# Patient Record
Sex: Male | Born: 1965
Health system: Southern US, Community
[De-identification: ages and names within clinical notes are randomized; demographics above are authoritative.]

## PROBLEM LIST (undated history)

## (undated) DIAGNOSIS — E079 Disorder of thyroid, unspecified: Secondary | ICD-10-CM

## (undated) DIAGNOSIS — Z87828 Personal history of other (healed) physical injury and trauma: Secondary | ICD-10-CM

## (undated) DIAGNOSIS — F329 Major depressive disorder, single episode, unspecified: Secondary | ICD-10-CM

## (undated) DIAGNOSIS — G4730 Sleep apnea, unspecified: Secondary | ICD-10-CM

## (undated) DIAGNOSIS — F32A Depression, unspecified: Secondary | ICD-10-CM

## (undated) DIAGNOSIS — K219 Gastro-esophageal reflux disease without esophagitis: Secondary | ICD-10-CM

## (undated) HISTORY — DX: Major depressive disorder, single episode, unspecified: F32.9

## (undated) HISTORY — PX: MANDIBLE FRACTURE SURGERY: SHX706

## (undated) HISTORY — DX: Disorder of thyroid, unspecified: E07.9

## (undated) HISTORY — DX: Gastro-esophageal reflux disease without esophagitis: K21.9

## (undated) HISTORY — PX: ESOPHAGOGASTRODUODENOSCOPY: SHX1529

## (undated) HISTORY — DX: Depression, unspecified: F32.A

## (undated) HISTORY — DX: Personal history of other (healed) physical injury and trauma: Z87.828

---

## 1997-11-11 ENCOUNTER — Ambulatory Visit (HOSPITAL_COMMUNITY): Admission: RE | Admit: 1997-11-11 | Discharge: 1997-11-11 | Payer: Self-pay | Admitting: Gastroenterology

## 1998-03-08 ENCOUNTER — Ambulatory Visit (HOSPITAL_COMMUNITY): Admission: RE | Admit: 1998-03-08 | Discharge: 1998-03-08 | Payer: Self-pay | Admitting: Obstetrics and Gynecology

## 1998-09-12 ENCOUNTER — Encounter: Payer: Self-pay | Admitting: Gastroenterology

## 1998-09-12 ENCOUNTER — Ambulatory Visit (HOSPITAL_COMMUNITY): Admission: RE | Admit: 1998-09-12 | Discharge: 1998-09-12 | Payer: Self-pay | Admitting: Gastroenterology

## 2007-08-03 ENCOUNTER — Emergency Department (HOSPITAL_COMMUNITY): Admission: EM | Admit: 2007-08-03 | Discharge: 2007-08-03 | Payer: Self-pay | Admitting: Emergency Medicine

## 2007-08-19 ENCOUNTER — Ambulatory Visit (HOSPITAL_COMMUNITY): Admission: RE | Admit: 2007-08-19 | Discharge: 2007-08-19 | Payer: Self-pay | Admitting: Gastroenterology

## 2008-04-05 ENCOUNTER — Encounter: Admission: RE | Admit: 2008-04-05 | Discharge: 2008-06-16 | Payer: Self-pay | Admitting: *Deleted

## 2009-05-18 ENCOUNTER — Emergency Department (HOSPITAL_COMMUNITY): Admission: EM | Admit: 2009-05-18 | Discharge: 2009-05-18 | Payer: Self-pay | Admitting: Emergency Medicine
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
## 2009-11-06 ENCOUNTER — Encounter (INDEPENDENT_AMBULATORY_CARE_PROVIDER_SITE_OTHER): Payer: Self-pay | Admitting: *Deleted

## 2010-02-25 ENCOUNTER — Encounter: Payer: Self-pay | Admitting: Gastroenterology

## 2010-03-06 NOTE — Letter (Signed)
Summary: New Patient letter  [facility]  [address] [place], [place] [address]   Phone: [phone]  Fax: [phone]       11/06/2009 MRN: [id number]  Tim Lopez [address] [facility] [address] [place], [place]  [address]  Dear Mr. Volkman,  Welcome to the Gastroenterology Division at [facility]    You are scheduled to see Dr.  Christella Hartigan on 11-10-09 at 2:00p.m. on the 3rd floor at [facility], [address]  We ask that you try to arrive at our office 15 minutes prior to your appointment time to allow for check-in.  We would like you to complete the enclosed self-administered evaluation form prior to your visit and bring it with you on the day of your appointment.  We will review it with you.  Also, please bring a complete list of all your medications or, if you prefer, bring the medication bottles and we will list them.  Please bring your insurance card so that we may make a copy of it.  If your insurance requires a referral to see a specialist, please bring your referral form from your primary care physician.  Co-payments are due at the time of your visit and may be paid by cash, check or credit card.     Your office visit will consist of a consult with your physician (includes a physical exam), any laboratory testing he/she may order, scheduling of any necessary diagnostic testing (e.g. x-ray, ultrasound, CT-scan), and scheduling of a procedure (e.g. Endoscopy, Colonoscopy) if required.  Please allow enough time on your schedule to allow for any/all of these possibilities.    If you cannot keep your appointment, please call [phone] to cancel or reschedule prior to your appointment date.  This allows [place] the opportunity to schedule an appointment for another patient in need of care.  If you do not cancel or reschedule by 5 p.m. the business day prior to your appointment date, you will be charged a $50.00 late cancellation/no-show fee.    Thank you for choosing  [facility] for your medical needs.  We appreciate the opportunity to care for you.  Please visit [place] at our website  to learn more about our practice.                     Sincerely,                                                             The Gastroenterology Division

## 2010-04-25 LAB — LIPASE, BLOOD: Lipase: 27 U/L (ref 11–59)

## 2010-04-25 LAB — COMPREHENSIVE METABOLIC PANEL
ALT: 22 U/L (ref 0–53)
AST: 29 U/L (ref 0–37)
Albumin: 4.8 g/dL (ref 3.5–5.2)
Alkaline Phosphatase: 97 U/L (ref 39–117)
Calcium: 10.5 mg/dL (ref 8.4–10.5)
GFR calc Af Amer: >60 mL/min (ref >60)
Potassium: 3.5 mEq/L (ref 3.5–5.1)
Sodium: 137 mEq/L (ref 135–145)
Total Protein: 8.7 g/dL — ABNORMAL HIGH (ref 6.0–8.3)

## 2010-04-25 LAB — COMPREHENSIVE METABOLIC PANEL WITH GFR
BUN: 6 mg/dL (ref 6–23)
CO2: 27 meq/L (ref 19–32)
Chloride: 102 meq/L (ref 96–112)
Creatinine, Ser: 0.97 mg/dL (ref 0.4–1.5)
GFR calc non Af Amer: >60 mL/min (ref >60)
Glucose, Bld: 102 mg/dL — ABNORMAL HIGH (ref 70–99)
Total Bilirubin: 1.1 mg/dL (ref 0.3–1.2)

## 2010-04-25 LAB — DIFFERENTIAL
Basophils Absolute: 0 K/uL (ref 0.0–0.1)
Basophils Relative: 0 % (ref 0–1)
Eosinophils Absolute: 0.1 10*3/uL (ref 0.0–0.7)
Eosinophils Relative: 1 % (ref 0–5)
Lymphocytes Relative: 25 % (ref 12–46)
Lymphs Abs: 1.4 10*3/uL (ref 0.7–4.0)
Monocytes Absolute: 0.4 10*3/uL (ref 0.1–1.0)
Monocytes Relative: 7 % (ref 3–12)
Neutro Abs: 3.8 K/uL (ref 1.7–7.7)
Neutrophils Relative %: 66 % (ref 43–77)

## 2010-04-25 LAB — URINE MICROSCOPIC-ADD ON

## 2010-04-25 LAB — URINALYSIS, ROUTINE W REFLEX MICROSCOPIC
Bilirubin Urine: NEGATIVE
Glucose, UA: NEGATIVE mg/dL
Ketones, ur: NEGATIVE mg/dL
Leukocytes, UA: NEGATIVE
Nitrite: NEGATIVE
Protein, ur: NEGATIVE mg/dL
Specific Gravity, Urine: 1.018 (ref 1.005–1.030)
Urobilinogen, UA: 1 mg/dL (ref 0.0–1.0)
pH: 7.5 (ref 5.0–8.0)

## 2010-04-25 LAB — CBC
HCT: 45.3 % (ref 39.0–52.0)
Hemoglobin: 16 g/dL (ref 13.0–17.0)
MCHC: 35.3 g/dL (ref 30.0–36.0)
MCV: 90.1 fL (ref 78.0–100.0)
Platelets: 194 10*3/uL (ref 150–400)
RBC: 5.03 MIL/uL (ref 4.22–5.81)
RDW: 13.1 % (ref 11.5–15.5)
WBC: 5.7 K/uL (ref 4.0–10.5)

## 2010-06-19 NOTE — Op Note (Signed)
NAMEADELFO, Tim Lopez          ACCOUNT NO.:  [id number]   MEDICAL RECORD NO.:  [id number]          PATIENT TYPE:  AMB   LOCATION:  ENDO                         FACILITY:  [facility]   PHYSICIAN:  James L. Malon Kindle., M.D.DATE OF BIRTH:  1965/08/28   DATE OF PROCEDURE:  08/19/2007  DATE OF DISCHARGE:                               OPERATIVE REPORT   PROCEDURE:  Esophagogastroduodenoscopy with Savarydilatation.   ENDOSCOPIST:  Dr. Fayrene Fearing L. Edwards.   INDICATIONS FOR PROCEDURE:  The patient is a nice gentleman who had a  previous dilatation for an esophageal stricture.  He last was dilated in  2000, with __________ then.  He complained of some increased dysphagia  and for this reason he was referred for a dilatation to be performed.   DESCRIPTION OF PROCEDURE:  The procedure was explained to the patient in  the office including the risks of bleeding and perforation.  The patient  was placed in the left lateral decubitus position in the Endo unit using  fluoroscopic guidance.  The scope was passed down into the esophagus.  The distal esophagus was reached and the patient did have a mild  stricture.  This was easily passed.  The stomach was seen well, as was  the duodenum  __________ normal.  The scope was then withdrawn into the  stomach and using fluoroscopic guidance, a Savary guide wire was placed  through the scope and the scope withdrawn over the guide wire.  The  patient's neck was then extended.  The 12.8 mm, 14 mm and 15 mm dilators  were passed using fluoroscopic guidance.  There was a small amount of  heme with the 15 mm dilator.  The total fluoroscopy time was  approximately 1.4 minutes.   The patient tolerated the procedure well.  There were no immediate  complications.   ASSESSMENT:  Esophageal stricture, dilated to 15 mm.   PLAN:  Will recommend clear liquids today.  Routine post-dilatation  orders.  Will also recommend that he take Pepcid or Zantac b.i.d.  Have  him  call the office with a report in one week.           ______________________________  Llana Aliment. Malon Kindle., M.D.     Waldron Session  D:  08/19/2007  T:  08/19/2007  Job:  [id number]   cc:   Samuel Jester  Fax: [phone]

## 2010-10-04 ENCOUNTER — Other Ambulatory Visit: Payer: Self-pay | Admitting: Gastroenterology

## 2010-10-15 ENCOUNTER — Ambulatory Visit
Admission: RE | Admit: 2010-10-15 | Discharge: 2010-10-15 | Disposition: A | Discharge status: Home / Self Care | Payer: Federal, State, Local not specified - PPO | Source: Ambulatory Visit | Attending: Gastroenterology | Admitting: Gastroenterology

## 2013-04-22 ENCOUNTER — Other Ambulatory Visit: Payer: Self-pay | Admitting: Physician Assistant

## 2013-04-22 ENCOUNTER — Ambulatory Visit
Admission: RE | Admit: 2013-04-22 | Discharge: 2013-04-22 | Disposition: A | Discharge status: Home / Self Care | Payer: Federal, State, Local not specified - PPO | Source: Ambulatory Visit | Attending: Physician Assistant | Admitting: Physician Assistant

## 2013-04-22 DIAGNOSIS — R6884 Jaw pain: Secondary | ICD-10-CM

## 2013-04-22 DIAGNOSIS — T1490XA Injury, unspecified, initial encounter: Secondary | ICD-10-CM

## 2014-12-19 ENCOUNTER — Other Ambulatory Visit: Payer: Self-pay | Admitting: Gastroenterology

## 2014-12-19 DIAGNOSIS — R1310 Dysphagia, unspecified: Secondary | ICD-10-CM

## 2015-01-02 ENCOUNTER — Ambulatory Visit
Admission: RE | Admit: 2015-01-02 | Discharge: 2015-01-02 | Disposition: A | Discharge status: Home / Self Care | Payer: Federal, State, Local not specified - PPO | Source: Ambulatory Visit | Attending: Gastroenterology | Admitting: Gastroenterology

## 2015-01-02 DIAGNOSIS — R1310 Dysphagia, unspecified: Secondary | ICD-10-CM

## 2015-10-02 ENCOUNTER — Encounter: Payer: Self-pay | Admitting: Physician Assistant

## 2015-10-02 ENCOUNTER — Ambulatory Visit (INDEPENDENT_AMBULATORY_CARE_PROVIDER_SITE_OTHER): Payer: Federal, State, Local not specified - PPO | Admitting: Physician Assistant

## 2015-10-02 VITALS — BP 125/87 | HR 69 | Temp 97.3°F | Ht 72.0 in | Wt 195.4 lb

## 2015-10-02 DIAGNOSIS — E039 Hypothyroidism, unspecified: Secondary | ICD-10-CM | POA: Diagnosis not present

## 2015-10-02 DIAGNOSIS — K2100 Gastro-esophageal reflux disease with esophagitis, without bleeding: Secondary | ICD-10-CM | POA: Insufficient documentation

## 2015-10-02 DIAGNOSIS — F329 Major depressive disorder, single episode, unspecified: Secondary | ICD-10-CM | POA: Diagnosis not present

## 2015-10-02 DIAGNOSIS — Z6826 Body mass index (BMI) 26.0-26.9, adult: Secondary | ICD-10-CM | POA: Diagnosis not present

## 2015-10-02 DIAGNOSIS — K222 Esophageal obstruction: Secondary | ICD-10-CM | POA: Diagnosis not present

## 2015-10-02 DIAGNOSIS — F32A Depression, unspecified: Secondary | ICD-10-CM

## 2015-10-02 MED ORDER — BUPROPION HCL ER (XL) 150 MG PO TB24: 150.0000 mg | ORAL_TABLET | Freq: Every day | ORAL | 1 refills | Status: DC

## 2015-10-02 MED ORDER — ESOMEPRAZOLE MAGNESIUM 40 MG PO CPDR: 40.0000 mg | DELAYED_RELEASE_CAPSULE | Freq: Every day | ORAL | 11 refills | Status: DC

## 2015-10-02 NOTE — Patient Instructions (Signed)
  Adjustment Disorder Adjustment disorder is an unusually severe reaction to a stressful life event, such as the loss of a job or physical illness. The event may be any stressful event other than the loss of a loved one. Adjustment disorder may affect your feelings, your thinking, how you act, or a combination of these. It may interfere with personal relationships or with the way you are at work, school, or home. People with this disorder are at risk for suicide and substance abuse. They may develop a more serious mental disorder, such as major depressive disorder or post-traumatic stress disorder. SIGNS AND SYMPTOMS  Symptoms may include:  Sadness, depressed mood, or crying spells.  Loss of enjoyment.  Change in appetite or weight.  Sense of loss or hopelessness.  Thoughts of suicide.  Anxiety, worry, or nervousness.  Trouble sleeping.  Avoiding family and friends.  Poor school performance.  Fighting or vandalism.  Reckless driving.  Skipping school.  Poor work performance.  Ignoring bills. Symptoms of adjustment disorder start within 3 months of the stressful life event. They do not last more than 6 months after the event has ended. DIAGNOSIS  To make a diagnosis, your health care provider will ask about what has happened in your life and how it has affected you. He or she may also ask about your medical history and use of medicines, alcohol, and other substances. Your health care provider may do a physical exam and order lab tests or other studies. You may be referred to a mental health specialist for evaluation. TREATMENT  Treatment options include:  Counseling or talk therapy. Talk therapy is usually provided by mental health specialists.  Medicine. Certain medicines may help with depression, anxiety, and sleep.  Support groups. Support groups offer emotional support, advice, and guidance. They are made up of people who have had similar experiences. HOME CARE  INSTRUCTIONS  Keep all follow-up visits as directed by your health care provider. This is important.  Take medicines only as directed by your health care provider. SEEK MEDICAL CARE IF:  Your symptoms get worse.  SEEK IMMEDIATE MEDICAL CARE IF: You have serious thoughts about hurting yourself or someone else. MAKE SURE YOU:  Understand these instructions.  Will watch your condition.  Will get help right away if you are not doing well or get worse.   This information is not intended to replace advice given to you by your health care provider. Make sure you discuss any questions you have with your health care provider.   Document Released: 09/25/2005 Document Revised: 02/11/2014 Document Reviewed: 06/15/2013 Elsevier Interactive Patient Education 2016 Elsevier Inc.  

## 2015-10-02 NOTE — Progress Notes (Signed)
BP 125/87 (BP Location: Left Arm, Patient Position: Sitting, Cuff Size: Large)   Pulse 69   Temp 97.3 F (36.3 C) (Oral)   Ht 6' (1.829 m)   Wt 195 lb 6.4 oz (88.6 kg)   BMI 26.50 kg/m    Subjective:    Patient ID: Tim PriestChristopher T Parkhurst, male    DOB: [birth year], 50 y.o.   MRN: [id number]  HPI: Tim Lopez is a 50 y.o. male presenting on 10/02/2015 for Stress (Was put on citalopram about 90 days ago and he stopped it a week after he started due to keeping him up at night ) Patient here to be [facility] as [facility].  This patient [facility] to [facility]. HPI:  HE has a return of the stress and overwhelming feeling he has had in th epast. He was recognized as [employer] needs [employer].  He works a tremendous amount and is always very busy.  At this time he feels that he cannot keep up with his daily routine and work load. He has a lot of work assignments this week and cannot be out but thinks that a short leave would be beneficial. He has had to do this in the past. His GERD is also much worse and needs different treatment. Currently he has only taken OC omprazole and has had breakthrough episodes. All medications are reviewed.  Relevant past medical, surgical, family and social history reviewed and updated as indicated. Interim medical history since our last visit reviewed. Allergies and medications reviewed and updated.   Data reviewed from any sources in EPIC.  Review of Systems  Constitutional: Positive for activity change and fatigue. Negative for appetite change.  HENT: Negative.   Eyes: Negative.  Negative for pain and visual disturbance.  Respiratory: Negative.  Negative for cough, chest tightness, shortness of breath and wheezing.   Cardiovascular: Negative.  Negative for chest pain, palpitations and leg swelling.  Gastrointestinal: Negative.  Negative for abdominal pain, blood in stool, diarrhea, nausea and  vomiting.  Endocrine: Negative.   Genitourinary: Negative.   Musculoskeletal: Negative.   Skin: Negative.  Negative for color change and rash.  Neurological: Negative.  Negative for weakness, numbness and headaches.  Psychiatric/Behavioral: Positive for agitation, decreased concentration and sleep disturbance. The patient is nervous/anxious.     Per HPI unless specifically indicated above  Social History   Social History  . Marital status: Married    Spouse name: N/A  . Number of children: N/A  . Years of education: N/A   Occupational History  . Not on file.   Social History Main Topics  . Smoking status: Never Smoker  . Smokeless tobacco: Never Used  . Alcohol use No  . Drug use: No  . Sexual activity: Not on file   Other Topics Concern  . Not on file   Social History Narrative  . No narrative on file    History reviewed. No pertinent surgical history.  Family History  Problem Relation Age of Onset  . Arthritis Father   . Diabetes Father   . Hyperlipidemia Father   . Hypertension Father       Medication List       Accurate as of 10/02/15 11:31 AM. Always use your most recent med list.          buPROPion 150 MG 24 hr tablet Commonly known as:  WELLBUTRIN XL Take 1 tablet (150 mg total) by mouth daily. Increase  to 2 tabs QAM in 1-2 weeks   esomeprazole 40 MG capsule Commonly known as:  NEXIUM Take 1 capsule (40 mg total) by mouth daily.   levothyroxine 88 MCG tablet Commonly known as:  SYNTHROID, LEVOTHROID Take 88 mcg by mouth daily before breakfast.          Objective:    BP 125/87 (BP Location: Left Arm, Patient Position: Sitting, Cuff Size: Large)   Pulse 69   Temp 97.3 F (36.3 C) (Oral)   Ht 6' (1.829 m)   Wt 195 lb 6.4 oz (88.6 kg)   BMI 26.50 kg/m   Allergies  Allergen Reactions  . Morphine And Related Hives   Wt Readings from Last 3 Encounters:  10/02/15 195 lb 6.4 oz (88.6 kg)    Physical Exam  Constitutional: He appears  well-developed and well-nourished.  HENT:  Head: Normocephalic and atraumatic.  Eyes: Conjunctivae and EOM are normal. Pupils are equal, round, and reactive to light.  Neck: Normal range of motion. Neck supple.  Cardiovascular: Normal rate, regular rhythm and normal heart sounds.   Pulmonary/Chest: Effort normal and breath sounds normal.  Abdominal: Soft. Bowel sounds are normal.  Musculoskeletal: Normal range of motion.  Skin: Skin is warm and dry.        Assessment & Plan:   1. Hypothyroidism, unspecified hypothyroidism type Labs performed in last 90 days and was normal, continue levothyroxine one daily  2. Gastroesophageal reflux disease with esophagitis Watch diet, stop prilosec, start new med - esomeprazole (NEXIUM) 40 MG capsule; Take 1 capsule (40 mg total) by mouth daily.  Dispense: 30 capsule; Refill: 11  3. Stricture and stenosis of esophagus Refer in future if symptoms do not resolve  4. Body mass index 26.0-26.9, adult Walking daily  5. Depression Stress reduction - buPROPion (WELLBUTRIN XL) 150 MG 24 hr tablet; Take 1 tablet (150 mg total) by mouth daily. Increase to 2 tabs QAM in 1-2 weeks  Dispense: 60 tablet; Refill: 1   Continue all other maintenance medications as listed above.  Follow up plan: Return in about 4 weeks (around 10/30/2015) for recheck meds anad lab.  Remus Loffler PA-C [facility] [address]  [place], [place] [address] [phone]   10/02/2015, 11:31 AM

## 2015-10-19 ENCOUNTER — Encounter: Payer: Self-pay | Admitting: Physician Assistant

## 2015-10-26 ENCOUNTER — Encounter: Payer: Self-pay | Admitting: Physician Assistant

## 2015-10-26 ENCOUNTER — Encounter: Payer: Self-pay | Admitting: *Deleted

## 2015-10-26 DIAGNOSIS — F4310 Post-traumatic stress disorder, unspecified: Secondary | ICD-10-CM | POA: Insufficient documentation

## 2015-10-30 MED ORDER — IBUPROFEN 800 MG PO TABS: 800.0000 mg | ORAL_TABLET | Freq: Three times a day (TID) | ORAL | 0 refills | Status: DC | PRN

## 2015-11-06 ENCOUNTER — Encounter: Payer: Self-pay | Admitting: *Deleted

## 2016-01-13 ENCOUNTER — Other Ambulatory Visit: Payer: Self-pay | Admitting: Physician Assistant

## 2016-04-01 ENCOUNTER — Encounter: Payer: Self-pay | Admitting: Physician Assistant

## 2016-04-01 ENCOUNTER — Ambulatory Visit (INDEPENDENT_AMBULATORY_CARE_PROVIDER_SITE_OTHER): Payer: Federal, State, Local not specified - PPO | Admitting: Physician Assistant

## 2016-04-01 VITALS — BP 117/80 | HR 78 | Temp 97.5°F | Ht 72.0 in | Wt 192.6 lb

## 2016-04-01 DIAGNOSIS — Z87828 Personal history of other (healed) physical injury and trauma: Secondary | ICD-10-CM

## 2016-04-01 DIAGNOSIS — R413 Other amnesia: Secondary | ICD-10-CM

## 2016-04-01 DIAGNOSIS — F320 Major depressive disorder, single episode, mild: Secondary | ICD-10-CM | POA: Diagnosis not present

## 2016-04-01 MED ORDER — BUPROPION HCL ER (XL) 150 MG PO TB24: 150.0000 mg | ORAL_TABLET | Freq: Every day | ORAL | 1 refills | Status: DC

## 2016-04-01 NOTE — Patient Instructions (Signed)
In a few days you may receive a survey in the mail or online from [employer] regarding your visit with us today. Please take a moment to fill this out. Your feedback is very important to our whole office. It can help us better understand your needs as well as improve your experience and satisfaction. Thank you for taking your time to complete it. We care about you.  Jeremaine Maraj, PA-C  
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
## 2016-04-02 ENCOUNTER — Other Ambulatory Visit: Payer: Self-pay | Admitting: Physician Assistant

## 2016-04-02 LAB — TSH: TSH: 5.08 u[IU]/mL — AB (ref 0.450–4.500)

## 2016-04-02 MED ORDER — LEVOTHYROXINE SODIUM 100 MCG PO TABS: 100.0000 ug | ORAL_TABLET | Freq: Every day | ORAL | 0 refills | Status: DC

## 2016-04-04 ENCOUNTER — Encounter: Payer: Self-pay | Admitting: Physician Assistant

## 2016-04-05 DIAGNOSIS — Z0289 Encounter for other administrative examinations: Secondary | ICD-10-CM

## 2016-04-15 ENCOUNTER — Other Ambulatory Visit: Payer: Self-pay | Admitting: Physician Assistant

## 2016-04-19 ENCOUNTER — Ambulatory Visit (INDEPENDENT_AMBULATORY_CARE_PROVIDER_SITE_OTHER): Payer: Federal, State, Local not specified - PPO | Admitting: Neurology

## 2016-04-19 ENCOUNTER — Encounter: Payer: Self-pay | Admitting: Neurology

## 2016-04-19 VITALS — BP 114/72 | HR 75 | Ht 72.0 in | Wt 195.0 lb

## 2016-04-19 DIAGNOSIS — R413 Other amnesia: Secondary | ICD-10-CM | POA: Diagnosis not present

## 2016-04-19 DIAGNOSIS — G479 Sleep disorder, unspecified: Secondary | ICD-10-CM | POA: Diagnosis not present

## 2016-04-19 NOTE — Patient Instructions (Signed)
   We will get blood work today and get MRI of the brain and get a sleep evaluation.
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
## 2016-04-20 LAB — HIV ANTIBODY (ROUTINE TESTING W REFLEX): HIV Screen 4th Generation wRfx: NONREACTIVE

## 2016-04-20 LAB — RPR: RPR Ser Ql: NONREACTIVE

## 2016-04-20 LAB — VITAMIN B12: Vitamin B-12: 503 pg/mL (ref 232–1245)

## 2016-04-20 LAB — SEDIMENTATION RATE: SED RATE: 2 mm/h (ref 0–30)

## 2016-05-03 ENCOUNTER — Telehealth: Payer: Self-pay | Admitting: Neurology

## 2016-05-03 ENCOUNTER — Ambulatory Visit
Admission: RE | Admit: 2016-05-03 | Discharge: 2016-05-03 | Disposition: A | Discharge status: Home / Self Care | Payer: Federal, State, Local not specified - PPO | Source: Ambulatory Visit | Attending: Neurology | Admitting: Neurology

## 2016-05-03 DIAGNOSIS — R413 Other amnesia: Secondary | ICD-10-CM

## 2016-05-03 NOTE — Telephone Encounter (Signed)
I called patient. MRI the brain is completely normal, I discussed this with him. He will have a sleep evaluation in the near future.   MRI brain 05/03/16:  IMPRESSION:  Normal MRI scan the brain without contrast

## 2016-05-15 ENCOUNTER — Encounter: Payer: Self-pay | Admitting: Neurology

## 2016-05-15 ENCOUNTER — Ambulatory Visit (INDEPENDENT_AMBULATORY_CARE_PROVIDER_SITE_OTHER): Payer: Federal, State, Local not specified - PPO | Admitting: Neurology

## 2016-05-15 VITALS — BP 134/84 | HR 72 | Resp 20 | Ht 72.0 in | Wt 192.0 lb

## 2016-05-15 DIAGNOSIS — G4719 Other hypersomnia: Secondary | ICD-10-CM

## 2016-05-15 DIAGNOSIS — R413 Other amnesia: Secondary | ICD-10-CM

## 2016-05-15 DIAGNOSIS — R0683 Snoring: Secondary | ICD-10-CM

## 2016-05-15 NOTE — Patient Instructions (Addendum)
Based on your symptoms and your exam I believe you are at risk for obstructive sleep apnea or OSA, and I think we should proceed with a sleep study to determine whether you do or do not have OSA and how severe it is. If you have more than mild OSA, I want you to consider treatment with CPAP. Please remember, the risks and ramifications of moderate to severe obstructive sleep apnea or OSA are: Cardiovascular disease, including congestive heart failure, stroke, difficult to control hypertension, arrhythmias, and even type 2 diabetes has been linked to untreated OSA. Sleep apnea causes disruption of sleep and sleep deprivation in most cases, which, in turn, can cause recurrent headaches, problems with memory, mood, concentration, focus, and vigilance. Most people with untreated sleep apnea report excessive daytime sleepiness, which can affect their ability to drive. Please do not drive if you feel sleepy.   I will likely see you back after your sleep study to go over the test results and where to go from there. We will call you after your sleep study to advise about the results (most likely, you will hear from Lafonda Mosses, my nurse) and to set up an appointment at the time, as necessary.    Our sleep lab administrative assistant, Alvis Lemmings will meet with you or call you to schedule your sleep study. If you don't hear back from her by next week please feel free to call her at [phone]. This is her direct line and please leave a message with your phone number to call back if you get the voicemail box. She will call back as soon as possible.   Please think about doing the sleep study.
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
## 2016-05-18 ENCOUNTER — Encounter: Payer: Self-pay | Admitting: Physician Assistant

## 2016-05-28 ENCOUNTER — Encounter: Payer: Self-pay | Admitting: Physician Assistant

## 2016-06-03 ENCOUNTER — Telehealth: Payer: Self-pay | Admitting: Physician Assistant

## 2016-06-03 ENCOUNTER — Encounter: Payer: Self-pay | Admitting: *Deleted

## 2016-06-03 NOTE — Telephone Encounter (Signed)
This is true, can anyone tell if the letter has been done? I had said it was okay.

## 2016-06-03 NOTE — Telephone Encounter (Signed)
Patient aware that letter has been wrote.

## 2016-07-06 ENCOUNTER — Other Ambulatory Visit: Payer: Self-pay | Admitting: Physician Assistant

## 2016-10-16 ENCOUNTER — Other Ambulatory Visit: Payer: Self-pay | Admitting: Physician Assistant

## 2016-11-06 ENCOUNTER — Other Ambulatory Visit: Payer: Self-pay | Admitting: Physician Assistant

## 2016-11-06 DIAGNOSIS — K2100 Gastro-esophageal reflux disease with esophagitis, without bleeding: Secondary | ICD-10-CM

## 2016-11-27 ENCOUNTER — Other Ambulatory Visit: Payer: Self-pay | Admitting: Physician Assistant

## 2017-01-02 ENCOUNTER — Other Ambulatory Visit: Payer: Self-pay | Admitting: Physician Assistant

## 2017-01-02 DIAGNOSIS — K2100 Gastro-esophageal reflux disease with esophagitis, without bleeding: Secondary | ICD-10-CM

## 2017-01-02 NOTE — Telephone Encounter (Signed)
Last seen 04/01/16  [facility]

## 2017-05-12 ENCOUNTER — Other Ambulatory Visit: Payer: Self-pay | Admitting: Physician Assistant

## 2017-05-19 ENCOUNTER — Encounter: Payer: Self-pay | Admitting: Physician Assistant

## 2017-05-19 ENCOUNTER — Telehealth: Payer: Self-pay | Admitting: Physician Assistant

## 2017-05-19 ENCOUNTER — Other Ambulatory Visit: Payer: Self-pay | Admitting: Physician Assistant

## 2017-05-19 MED ORDER — LEVOTHYROXINE SODIUM 100 MCG PO TABS: ORAL_TABLET | ORAL | 0 refills | Status: DC

## 2017-05-19 NOTE — Telephone Encounter (Signed)
Patient's wife aware that prescription was sent in.

## 2017-05-19 NOTE — Telephone Encounter (Signed)
Refill sent.

## 2017-06-06 ENCOUNTER — Ambulatory Visit: Payer: Federal, State, Local not specified - PPO | Admitting: Physician Assistant

## 2017-06-06 ENCOUNTER — Encounter: Payer: Self-pay | Admitting: Physician Assistant

## 2017-06-06 VITALS — BP 102/70 | HR 109 | Ht 72.0 in | Wt 198.6 lb

## 2017-06-06 DIAGNOSIS — K2100 Gastro-esophageal reflux disease with esophagitis, without bleeding: Secondary | ICD-10-CM

## 2017-06-06 DIAGNOSIS — E039 Hypothyroidism, unspecified: Secondary | ICD-10-CM | POA: Diagnosis not present

## 2017-06-06 DIAGNOSIS — Z0000 Encounter for general adult medical examination without abnormal findings: Secondary | ICD-10-CM

## 2017-06-06 MED ORDER — OMEPRAZOLE 20 MG PO CPDR: 20.0000 mg | DELAYED_RELEASE_CAPSULE | Freq: Every day | ORAL | 3 refills | Status: DC

## 2017-06-06 MED ORDER — LEVOTHYROXINE SODIUM 100 MCG PO TABS: ORAL_TABLET | ORAL | 3 refills | Status: DC

## 2017-06-07 LAB — CMP14+EGFR
A/G RATIO: 1.8 (ref 1.2–2.2)
ALK PHOS: 77 IU/L (ref 39–117)
ALT: 40 IU/L (ref 0–44)
AST: 46 IU/L — ABNORMAL HIGH (ref 0–40)
Albumin: 4.6 g/dL (ref 3.5–5.5)
BILIRUBIN TOTAL: 0.8 mg/dL (ref 0.0–1.2)
BUN / CREAT RATIO: 10 (ref 9–20)
BUN: 13 mg/dL (ref 6–24)
CHLORIDE: 102 mmol/L (ref 96–106)
CO2: 29 mmol/L (ref 20–29)
Calcium: 10.3 mg/dL — ABNORMAL HIGH (ref 8.7–10.2)
Creatinine, Ser: 1.34 mg/dL — ABNORMAL HIGH (ref 0.76–1.27)
GFR calc Af Amer: 70 mL/min/{1.73_m2} (ref >59)
GFR calc non Af Amer: 61 mL/min/{1.73_m2} (ref >59)
GLOBULIN, TOTAL: 2.6 g/dL (ref 1.5–4.5)
Glucose: 103 mg/dL — ABNORMAL HIGH (ref 65–99)
POTASSIUM: 4.9 mmol/L (ref 3.5–5.2)
SODIUM: 141 mmol/L (ref 134–144)
Total Protein: 7.2 g/dL (ref 6.0–8.5)

## 2017-06-07 LAB — CBC WITH DIFFERENTIAL/PLATELET
BASOS ABS: 0 10*3/uL (ref 0.0–0.2)
Basos: 1 %
EOS (ABSOLUTE): 0.2 10*3/uL (ref 0.0–0.4)
EOS: 3 %
HEMATOCRIT: 40.3 % (ref 37.5–51.0)
Hemoglobin: 14.5 g/dL (ref 13.0–17.7)
Immature Grans (Abs): 0 10*3/uL (ref 0.0–0.1)
Immature Granulocytes: 0 %
LYMPHS ABS: 2.4 10*3/uL (ref 0.7–3.1)
Lymphs: 38 %
MCH: 30.6 pg (ref 26.6–33.0)
MCHC: 36 g/dL — AB (ref 31.5–35.7)
MCV: 85 fL (ref 79–97)
MONOCYTES: 8 %
MONOS ABS: 0.5 10*3/uL (ref 0.1–0.9)
NEUTROS ABS: 3.2 10*3/uL (ref 1.4–7.0)
Neutrophils: 50 %
Platelets: 193 10*3/uL (ref 150–379)
RBC: 4.74 x10E6/uL (ref 4.14–5.80)
RDW: 14 % (ref 12.3–15.4)
WBC: 6.4 10*3/uL (ref 3.4–10.8)

## 2017-06-07 LAB — TSH: TSH: 3.58 u[IU]/mL (ref 0.450–4.500)
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
## 2017-06-16 ENCOUNTER — Other Ambulatory Visit: Payer: Self-pay | Admitting: Physician Assistant

## 2017-06-16 ENCOUNTER — Encounter: Payer: Self-pay | Admitting: Physician Assistant

## 2017-06-16 MED ORDER — PREDNISONE 10 MG (21) PO TBPK: ORAL_TABLET | ORAL | 0 refills | Status: DC

## 2017-06-19 ENCOUNTER — Other Ambulatory Visit: Payer: Self-pay | Admitting: Physician Assistant

## 2017-06-26 ENCOUNTER — Ambulatory Visit: Payer: Federal, State, Local not specified - PPO | Admitting: Physician Assistant

## 2017-06-26 ENCOUNTER — Encounter: Payer: Self-pay | Admitting: Physician Assistant

## 2017-06-26 VITALS — BP 123/82 | HR 102 | Temp 96.8°F | Ht 72.0 in | Wt 199.4 lb

## 2017-06-26 DIAGNOSIS — M7022 Olecranon bursitis, left elbow: Secondary | ICD-10-CM

## 2017-06-26 MED ORDER — CEPHALEXIN 500 MG PO CAPS: 500.0000 mg | ORAL_CAPSULE | Freq: Four times a day (QID) | ORAL | 0 refills | Status: DC

## 2017-06-26 MED ORDER — DICLOFENAC SODIUM 75 MG PO TBEC: 75.0000 mg | DELAYED_RELEASE_TABLET | Freq: Two times a day (BID) | ORAL | 0 refills | Status: DC

## 2017-06-26 NOTE — Progress Notes (Signed)
BP 123/82   Pulse (!) 102   Temp (!) 96.8 F (36 C) (Oral)   Ht 6' (1.829 m)   Wt 199 lb 6.4 oz (90.4 kg)   BMI 27.04 kg/m     Subjective:    Patient ID: Tim Lopez, male    DOB: 12/24/1965, 52 y.o.   MRN: [id number]  HPI: Tim Lopez is a 52 y.o. male presenting on 06/26/2017 for Elbow Pain (left elbow red and swollen )  For the past 2 days the patient has had increased swelling of the left elbow.  Last week he did have  poison oak in that area and did have an open wound on his skin.  He has had significant swelling.  He denies any injury.    Past Medical History:  Diagnosis Date  . Depression   . GERD (gastroesophageal reflux disease)   . H/O head injury    jaw fracture, dental injury, skull fracture. 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 Thyroid disease    Relevant past medical, surgical, family and social history reviewed and updated as indicated. Interim medical history since our last visit reviewed. Allergies and medications reviewed and updated. DATA REVIEWED: CHART IN EPIC  Family History reviewed for pertinent findings.  Review of Systems  Constitutional: Negative.  Negative for appetite change and fatigue.  Eyes: Negative for pain and visual disturbance.  Respiratory: Negative.  Negative for cough, chest tightness, shortness of breath and wheezing.   Cardiovascular: Negative.  Negative for chest pain, palpitations and leg swelling.  Gastrointestinal: Negative.  Negative for abdominal pain, diarrhea, nausea and vomiting.  Genitourinary: Negative.   Musculoskeletal: Positive for joint swelling and myalgias.  Skin: Negative.  Negative for color change and rash.  Neurological: Negative.  Negative for weakness, numbness and headaches.  Psychiatric/Behavioral: Negative.     Allergies as of 06/26/2017      Reactions   Morphine And Related Hives      Medication List        Accurate as of 06/26/17  3:23 PM. Always use your  most recent med list.          cephALEXin 500 MG capsule Commonly known as:  KEFLEX Take 1 capsule (500 mg total) by mouth 4 (four) times daily.   diclofenac 75 MG EC tablet Commonly known as:  VOLTAREN Take 1 tablet (75 mg total) by mouth 2 (two) times daily.   ibuprofen 800 MG tablet Commonly known as:  ADVIL,MOTRIN TAKE 1 TABLET BY MOUTH EVERY 8 HOURS AS NEEDED   levothyroxine 100 MCG tablet Commonly known as:  SYNTHROID, LEVOTHROID TAKE 1 TABLET BY MOUTH ONCE DAILY BEFORE BREAKFAST   omeprazole 20 MG capsule Commonly known as:  PRILOSEC Take 1 capsule (20 mg total) by mouth daily.          Objective:    BP 123/82   Pulse (!) 102   Temp (!) 96.8 F (36 C) (Oral)   Ht 6' (1.829 m)   Wt 199 lb 6.4 oz (90.4 kg)   BMI 27.04 kg/m    Allergies  Allergen Reactions  . Morphine And Related Hives    Wt Readings from Last 3 Encounters:  06/26/17 199 lb 6.4 oz (90.4 kg)  06/06/17 198 lb 9.6 oz (90.1 kg)  05/15/16 192 lb (87.1 kg)    Physical Exam  Constitutional: He appears well-developed and well-nourished. No distress.  HENT:  Head: Normocephalic and atraumatic.  Eyes: Pupils are equal,  round, and reactive to light. Conjunctivae and EOM are normal.  Cardiovascular: Normal rate, regular rhythm and normal heart sounds.  Pulmonary/Chest: Effort normal and breath sounds normal. No respiratory distress.  Musculoskeletal:       Left elbow: He exhibits swelling. Tenderness found. Lateral epicondyle tenderness noted.       Arms: Skin: Skin is warm and dry.  Psychiatric: He has a normal mood and affect. His behavior is normal.  Nursing note and vitals reviewed.       Assessment & Plan:   1. Olecranon bursitis of left elbow - diclofenac (VOLTAREN) 75 MG EC tablet; Take 1 tablet (75 mg total) by mouth 2 (two) times daily.  Dispense: 60 tablet; Refill: 0 - cephALEXin (KEFLEX) 500 MG capsule; Take 1 capsule (500 mg total) by mouth 4 (four) times daily.  Dispense: 40  capsule; Refill: 0    Continue all other maintenance medications as listed above.  Follow up plan: No follow-ups on file.  Educational handout given for bursitis  Remus Loffler PA-C [facility] [address]  [place], [place] [address] [phone]   06/26/2017, 3:23 PM

## 2017-06-26 NOTE — Patient Instructions (Signed)
Elbow Bursitis A bursa is a fluid-filled sac that covers and protects a joint. Bursitis is when the fluid-filled sac gets puffy and sore (inflamed). Elbow bursitis, also called olecranon bursitis, happens over your elbow. This may be caused by:  Injury (acute trauma) to your elbow.  Leaning on hard surfaces for long periods of time.  Infection from an injury that breaks the skin near your elbow.  A bone growth (spur) that forms at the tip of your elbow.  A medical condition that causes inflammation in your body, such as: ? Gout. ? Rheumatoid arthritis.  Sometimes the cause is not known. Follow these instructions at home:  Take medicines only as told by your doctor.  If you were prescribed an antibiotic medicine, finish all of it even if you start to feel better.  If your bursitis is caused by an injury, rest your elbow and wear your bandage as told by your doctor. You may also apply ice to the injured area as told by your doctor: ? Put ice in a plastic bag. ? Place a towel between your skin and the bag. ? Leave the ice on for 20 minutes, 2-3 times per day.  Do not do any activities that cause pain to your elbow.  Use elbow pads or wraps to cushion your elbow. Contact a doctor if:  You have a fever.  Your symptoms do not get better with treatment.  Your pain or swelling gets worse.  Your pain or swelling goes away and then comes back.  You have drainage of pus from the swollen area over your elbow. This information is not intended to replace advice given to you by your health care provider. Make sure you discuss any questions you have with your health care provider. Document Released: 07/11/2009 Document Revised: 06/29/2015 Document Reviewed: 09/29/2013 Elsevier Interactive Patient Education  2018 Elsevier Inc.  

## 2017-08-19 ENCOUNTER — Other Ambulatory Visit: Payer: Self-pay | Admitting: Physician Assistant

## 2017-09-23 ENCOUNTER — Encounter: Payer: Self-pay | Admitting: Nurse Practitioner

## 2017-09-23 ENCOUNTER — Ambulatory Visit: Payer: Federal, State, Local not specified - PPO | Admitting: Nurse Practitioner

## 2017-09-23 VITALS — Temp 98.6°F | Ht 72.0 in | Wt 199.0 lb

## 2017-09-23 DIAGNOSIS — L03032 Cellulitis of left toe: Secondary | ICD-10-CM | POA: Diagnosis not present

## 2017-09-23 MED ORDER — SULFAMETHOXAZOLE-TRIMETHOPRIM 800-160 MG PO TABS: 1.0000 | ORAL_TABLET | Freq: Two times a day (BID) | ORAL | 0 refills | Status: DC

## 2017-09-23 NOTE — Patient Instructions (Signed)
Paronychia  Paronychia is an infection of the skin. It happens near a fingernail or toenail. It may cause pain and swelling around the nail. Usually, it is not serious and it clears up with treatment.  Follow these instructions at home:   Soak the fingers or toes in warm water as told by your doctor. You may be told to do this for 20 minutes, 2-3 times a day.   Keep the area dry when you are not soaking it.   Take medicines only as told by your doctor.   If you were given an antibiotic medicine, finish all of it even if you start to feel better.   Keep the affected area clean.   Do not try to drain a fluid-filled bump yourself.   Wear rubber gloves when putting your hands in water.   Wear gloves if your hands might touch cleaners or chemicals.   Follow your doctor's instructions about:  ? Wound care.  ? Bandage (dressing) changes and removal.  Contact a doctor if:   Your symptoms get worse or do not improve.   You have a fever or chills.   You have redness spreading from the affected area.   You have more fluid, blood, or pus coming from the affected area.   Your finger or knuckle is swollen or is hard to move.  This information is not intended to replace advice given to you by your health care provider. Make sure you discuss any questions you have with your health care provider.  Document Released: 01/09/2009 Document Revised: 06/29/2015 Document Reviewed: 12/29/2013  Elsevier Interactive Patient Education  2018 Elsevier Inc.

## 2017-09-23 NOTE — Progress Notes (Signed)
   Subjective:    Patient ID: Tim Lopez, male    DOB: March 01, 1965, 52 y.o.   MRN: [id number]   Chief Complaint: Nail Problem   HPI Patient said that left first toe started throbbing 2 nights ago. Woke up yesterday morning and toe was red around nail bed. Denies any drainage   Review of Systems  Constitutional: Negative.   Respiratory: Negative.   Cardiovascular: Negative.   Musculoskeletal: Negative.   Skin:       reddness around left great toe  Neurological: Negative.   Psychiatric/Behavioral: Negative.   All other systems reviewed and are negative.      Objective:   Physical Exam  Constitutional: He is oriented to person, place, and time. He appears well-developed and well-nourished.  Cardiovascular: Normal rate.  Pulmonary/Chest: Effort normal.  Neurological: He is alert and oriented to person, place, and time.  Skin: Skin is warm.  Erythematous tender medial nail border left great toe.    Temp 98.6 F (37 C) (Oral)   Ht 6' (1.829 m)   Wt 199 lb (90.3 kg)   BMI 26.99 kg/m        Assessment & Plan:  Tim Lopez in today with chief complaint of Nail Problem   1. Infection of nail bed of toe of left foot Soak in epsom salt bid Do not pick or scratch Meds ordered this encounter  Medications  . sulfamethoxazole-trimethoprim (BACTRIM DS) 800-160 MG tablet    Sig: Take 1 tablet by mouth 2 (two) times daily.    Dispense:  14 tablet    Refill:  0    Order Specific Question:   Supervising Provider    Answer:   Johna SheriffVINCENT, CAROL L [[id number]]   Mary-Margaret Daphine DeutscherMartin, FNP

## 2017-11-04 ENCOUNTER — Other Ambulatory Visit: Payer: Self-pay | Admitting: Physician Assistant

## 2017-12-22 ENCOUNTER — Encounter: Payer: Self-pay | Admitting: Physician Assistant

## 2017-12-29 ENCOUNTER — Encounter: Payer: Self-pay | Admitting: Physician Assistant

## 2017-12-29 ENCOUNTER — Ambulatory Visit: Payer: Federal, State, Local not specified - PPO | Admitting: Physician Assistant

## 2017-12-29 VITALS — BP 136/79 | HR 96 | Temp 98.0°F | Ht 72.0 in | Wt 196.2 lb

## 2017-12-29 DIAGNOSIS — Z87828 Personal history of other (healed) physical injury and trauma: Secondary | ICD-10-CM | POA: Diagnosis not present

## 2017-12-29 DIAGNOSIS — F4310 Post-traumatic stress disorder, unspecified: Secondary | ICD-10-CM

## 2017-12-29 NOTE — Progress Notes (Signed)
BP 136/79   Pulse 96   Temp 98 F (36.7 C) (Oral)   Ht 6' (1.829 m)   Wt 196 lb 3.2 oz (89 kg)   BMI 26.61 kg/m    Subjective:    Patient ID: Tim Lopez, male    DOB: Dec 28, 1965, 52 y.o.   MRN: [id number]  HPI: Tim Lopez is a 52 y.o. male presenting on 12/29/2017 for Stress  This patient comes in for weight check posttraumatic stress disorder and history of head injury.  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  And he does need to have a note written for him to be out of work for the next few days.  He will have off and on absences over the next year.  Is walking more as needed.  There are no other complaints at this time.  Past Medical History:  Diagnosis Date  . Depression   . GERD (gastroesophageal reflux disease)   . H/O head injury    jaw fracture, dental injury, skull fracture. 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 Thyroid disease    Relevant past medical, surgical, family and social history reviewed and updated as indicated. Interim medical history since our last visit reviewed. Allergies and medications reviewed and updated. DATA REVIEWED: CHART IN EPIC  Family History reviewed for pertinent findings.  Review of Systems  Constitutional: Negative.  Negative for appetite change and fatigue.  Eyes: Negative for pain and visual disturbance.  Respiratory: Negative.  Negative for cough, chest tightness, shortness of breath and wheezing.   Cardiovascular: Negative.  Negative for chest pain, palpitations and leg swelling.  Gastrointestinal: Negative.  Negative for abdominal pain, diarrhea, nausea and vomiting.  Genitourinary: Negative.   Skin: Negative.  Negative for color change and rash.  Neurological: Positive for headaches. Negative for weakness and numbness.  Psychiatric/Behavioral: Positive for decreased concentration, dysphoric mood and sleep disturbance. Negative for agitation and suicidal ideas.    Allergies as of  12/29/2017      Reactions   Morphine And Related Hives      Medication List        Accurate as of 12/29/17 10:13 PM. Always use your most recent med list.          ibuprofen 800 MG tablet Commonly known as:  ADVIL,MOTRIN TAKE 1 TABLET BY MOUTH EVERY 8 HOURS AS NEEDED   levothyroxine 100 MCG tablet Commonly known as:  SYNTHROID, LEVOTHROID TAKE 1 TABLET BY MOUTH ONCE DAILY BEFORE BREAKFAST   omeprazole 20 MG capsule Commonly known as:  PRILOSEC Take 1 capsule (20 mg total) by mouth daily.          Objective:    BP 136/79   Pulse 96   Temp 98 F (36.7 C) (Oral)   Ht 6' (1.829 m)   Wt 196 lb 3.2 oz (89 kg)   BMI 26.61 kg/m   Allergies  Allergen Reactions  . Morphine And Related Hives    Wt Readings from Last 3 Encounters:  12/29/17 196 lb 3.2 oz (89 kg)  09/23/17 199 lb (90.3 kg)  06/26/17 199 lb 6.4 oz (90.4 kg)    Physical Exam  Constitutional: He appears well-developed and well-nourished.  HENT:  Head: Normocephalic and atraumatic.  Eyes: Pupils are equal, round, and reactive to light. Conjunctivae and EOM are normal.  Neck: Normal range of motion. Neck supple.  Cardiovascular: Normal rate, regular rhythm and normal heart sounds.  Pulmonary/Chest: Effort normal and breath sounds  normal.  Abdominal: Soft. Bowel sounds are normal.  Musculoskeletal: Normal range of motion.  Skin: Skin is warm and dry.    Results for orders placed or performed in visit on 06/06/17  CBC with Differential/Platelet  Result Value Ref Range   WBC 6.4 3.4 - 10.8 x10E3/uL   RBC 4.74 4.14 - 5.80 x10E6/uL   Hemoglobin 14.5 13.0 - 17.7 g/dL   Hematocrit 40.3 37.5 - 51.0 %   MCV 85 79 - 97 fL   MCH 30.6 26.6 - 33.0 pg   MCHC 36.0 (H) 31.5 - 35.7 g/dL   RDW 14.0 12.3 - 15.4 %   Platelets 193 150 - 379 x10E3/uL   Neutrophils 50 Not Estab. %   Lymphs 38 Not Estab. %   Monocytes 8 Not Estab. %   Eos 3 Not Estab. %   Basos 1 Not Estab. %   Neutrophils Absolute 3.2 1.4 - 7.0  x10E3/uL   Lymphocytes Absolute 2.4 0.7 - 3.1 x10E3/uL   Monocytes Absolute 0.5 0.1 - 0.9 x10E3/uL   EOS (ABSOLUTE) 0.2 0.0 - 0.4 x10E3/uL   Basophils Absolute 0.0 0.0 - 0.2 x10E3/uL   Immature Granulocytes 0 Not Estab. %   Immature Grans (Abs) 0.0 0.0 - 0.1 x10E3/uL  CMP14+EGFR  Result Value Ref Range   Glucose 103 (H) 65 - 99 mg/dL   BUN 13 6 - 24 mg/dL   Creatinine, Ser 1.34 (H) 0.76 - 1.27 mg/dL   GFR calc non Af Amer 61 >59 mL/min/1.73   GFR calc Af Amer 70 >59 mL/min/1.73   BUN/Creatinine Ratio 10 9 - 20   Sodium 141 134 - 144 mmol/L   Potassium 4.9 3.5 - 5.2 mmol/L   Chloride 102 96 - 106 mmol/L   CO2 29 20 - 29 mmol/L   Calcium 10.3 (H) 8.7 - 10.2 mg/dL   Total Protein 7.2 6.0 - 8.5 g/dL   Albumin 4.6 3.5 - 5.5 g/dL   Globulin, Total 2.6 1.5 - 4.5 g/dL   Albumin/Globulin Ratio 1.8 1.2 - 2.2   Bilirubin Total 0.8 0.0 - 1.2 mg/dL   Alkaline Phosphatase 77 39 - 117 IU/L   AST 46 (H) 0 - 40 IU/L   ALT 40 0 - 44 IU/L  TSH  Result Value Ref Range   TSH 3.580 0.450 - 4.500 uIU/mL      Assessment & Plan:   1. PTSD (post-traumatic stress disorder) Out of work note given  2. History of traumatic injury of head Out of work note given   Continue all other maintenance medications as listed above.  Follow up plan: Return in about 6 months (around 06/29/2018) for recheck.  Educational handout given for [place] PA-C [facility] [address]  [place],  [id number] [phone]   12/29/2017, 10:13 PM

## 2018-01-08 ENCOUNTER — Encounter: Payer: Self-pay | Admitting: Physician Assistant

## 2018-01-21 ENCOUNTER — Encounter: Payer: Self-pay | Admitting: Physician Assistant

## 2018-01-21 ENCOUNTER — Ambulatory Visit: Payer: Federal, State, Local not specified - PPO | Admitting: Physician Assistant

## 2018-01-21 VITALS — BP 138/89 | HR 106 | Temp 97.0°F | Ht 72.0 in | Wt 198.0 lb

## 2018-01-21 DIAGNOSIS — Z87828 Personal history of other (healed) physical injury and trauma: Secondary | ICD-10-CM

## 2018-01-21 DIAGNOSIS — F4310 Post-traumatic stress disorder, unspecified: Secondary | ICD-10-CM | POA: Diagnosis not present

## 2018-01-21 DIAGNOSIS — R413 Other amnesia: Secondary | ICD-10-CM

## 2018-01-25 NOTE — Progress Notes (Signed)
BP 138/89   Pulse (!) 106   Temp (!) 97 F (36.1 C) (Oral)   Ht 6' (1.829 m)   Wt 198 lb (89.8 kg)   BMI 26.85 kg/m    Subjective:    Patient ID: Tim Lopez, male    DOB: May 24, 1965, 52 y.o.   MRN: [id number]  HPI: Tim Lopez is a 52 y.o. male presenting on 01/21/2018 for Forms for work  This patient comes in for periodic recheck on medications and conditions including postherpetic stress disorder, history of head injury.  Memory change.  He does have long-term benefits of leave from work since it is next related to [employer].  Note will be written for him to be out.  He will follow-up in about a month..   All medications are reviewed today. There are no reports of any problems with the medications. All of the medical conditions are reviewed and updated.  Lab work is reviewed and will be ordered as medically necessary. There are no new problems reported with today's visit.    Past Medical History:  Diagnosis Date  . Depression   . GERD (gastroesophageal reflux disease)   . H/O head injury    jaw fracture, dental injury, skull fracture. 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 Thyroid disease    Relevant past medical, surgical, family and social history reviewed and updated as indicated. Interim medical history since our last visit reviewed. Allergies and medications reviewed and updated. DATA REVIEWED: CHART IN EPIC  Family History reviewed for pertinent findings.  Review of Systems  Constitutional: Negative.  Negative for appetite change and fatigue.  Eyes: Negative for pain and visual disturbance.  Respiratory: Negative.  Negative for cough, chest tightness, shortness of breath and wheezing.   Cardiovascular: Negative.  Negative for chest pain, palpitations and leg swelling.  Gastrointestinal: Negative.  Negative for abdominal pain, diarrhea, nausea and vomiting.  Genitourinary: Negative.   Skin: Negative.  Negative for color  change and rash.  Neurological: Positive for headaches. Negative for weakness and numbness.  Psychiatric/Behavioral: Positive for decreased concentration. The patient is nervous/anxious.     Allergies as of 01/21/2018      Reactions   Morphine And Related Hives      Medication List       Accurate as of January 21, 2018 11:59 PM. Always use your most recent med list.        ibuprofen 800 MG tablet Commonly known as:  ADVIL,MOTRIN TAKE 1 TABLET BY MOUTH EVERY 8 HOURS AS NEEDED   levothyroxine 100 MCG tablet Commonly known as:  SYNTHROID, LEVOTHROID TAKE 1 TABLET BY MOUTH ONCE DAILY BEFORE BREAKFAST   omeprazole 20 MG capsule Commonly known as:  PRILOSEC Take 1 capsule (20 mg total) by mouth daily.          Objective:    BP 138/89   Pulse (!) 106   Temp (!) 97 F (36.1 C) (Oral)   Ht 6' (1.829 m)   Wt 198 lb (89.8 kg)   BMI 26.85 kg/m   Allergies  Allergen Reactions  . Morphine And Related Hives    Wt Readings from Last 3 Encounters:  01/21/18 198 lb (89.8 kg)  12/29/17 196 lb 3.2 oz (89 kg)  09/23/17 199 lb (90.3 kg)    Physical Exam Vitals signs and nursing note reviewed.  Constitutional:      General: He is not in acute distress.    Appearance: He is  well-developed.  HENT:     Head: Normocephalic and atraumatic.  Eyes:     Conjunctiva/sclera: Conjunctivae normal.     Pupils: Pupils are equal, round, and reactive to light.  Cardiovascular:     Rate and Rhythm: Normal rate and regular rhythm.     Heart sounds: Normal heart sounds.  Pulmonary:     Effort: Pulmonary effort is normal. No respiratory distress.     Breath sounds: Normal breath sounds.  Skin:    General: Skin is warm and dry.  Psychiatric:        Behavior: Behavior normal.     Results for orders placed or performed in visit on 06/06/17  CBC with Differential/Platelet  Result Value Ref Range   WBC 6.4 3.4 - 10.8 x10E3/uL   RBC 4.74 4.14 - 5.80 x10E6/uL   Hemoglobin 14.5 13.0 -  17.7 g/dL   Hematocrit 40.3 37.5 - 51.0 %   MCV 85 79 - 97 fL   MCH 30.6 26.6 - 33.0 pg   MCHC 36.0 (H) 31.5 - 35.7 g/dL   RDW 14.0 12.3 - 15.4 %   Platelets 193 150 - 379 x10E3/uL   Neutrophils 50 Not Estab. %   Lymphs 38 Not Estab. %   Monocytes 8 Not Estab. %   Eos 3 Not Estab. %   Basos 1 Not Estab. %   Neutrophils Absolute 3.2 1.4 - 7.0 x10E3/uL   Lymphocytes Absolute 2.4 0.7 - 3.1 x10E3/uL   Monocytes Absolute 0.5 0.1 - 0.9 x10E3/uL   EOS (ABSOLUTE) 0.2 0.0 - 0.4 x10E3/uL   Basophils Absolute 0.0 0.0 - 0.2 x10E3/uL   Immature Granulocytes 0 Not Estab. %   Immature Grans (Abs) 0.0 0.0 - 0.1 x10E3/uL  CMP14+EGFR  Result Value Ref Range   Glucose 103 (H) 65 - 99 mg/dL   BUN 13 6 - 24 mg/dL   Creatinine, Ser 1.34 (H) 0.76 - 1.27 mg/dL   GFR calc non Af Amer 61 >59 mL/min/1.73   GFR calc Af Amer 70 >59 mL/min/1.73   BUN/Creatinine Ratio 10 9 - 20   Sodium 141 134 - 144 mmol/L   Potassium 4.9 3.5 - 5.2 mmol/L   Chloride 102 96 - 106 mmol/L   CO2 29 20 - 29 mmol/L   Calcium 10.3 (H) 8.7 - 10.2 mg/dL   Total Protein 7.2 6.0 - 8.5 g/dL   Albumin 4.6 3.5 - 5.5 g/dL   Globulin, Total 2.6 1.5 - 4.5 g/dL   Albumin/Globulin Ratio 1.8 1.2 - 2.2   Bilirubin Total 0.8 0.0 - 1.2 mg/dL   Alkaline Phosphatase 77 39 - 117 IU/L   AST 46 (H) 0 - 40 IU/L   ALT 40 0 - 44 IU/L  TSH  Result Value Ref Range   TSH 3.580 0.450 - 4.500 uIU/mL  leave from work    Assessment & Plan:   1. PTSD (post-traumatic stress disorder) Leave from work for the next couple of weeks  2. History of traumatic injury of head Leave from work  3. Memory change Leave  from work.   Continue all other maintenance medications as listed above.  Follow up plan: No follow-ups on file.  Educational handout given for [place] PA-C [facility] [address]  [place], [place] [address] [phone]   01/25/2018, 10:01 PM

## 2018-04-14 ENCOUNTER — Other Ambulatory Visit: Payer: Self-pay | Admitting: Physician Assistant

## 2018-04-14 DIAGNOSIS — K2100 Gastro-esophageal reflux disease with esophagitis, without bleeding: Secondary | ICD-10-CM

## 2018-05-28 ENCOUNTER — Encounter: Payer: Self-pay | Admitting: Physician Assistant

## 2018-05-29 DIAGNOSIS — Z029 Encounter for administrative examinations, unspecified: Secondary | ICD-10-CM

## 2018-06-09 ENCOUNTER — Encounter: Payer: Self-pay | Admitting: Physician Assistant

## 2018-06-10 ENCOUNTER — Other Ambulatory Visit: Payer: Self-pay | Admitting: Physician Assistant

## 2018-06-10 DIAGNOSIS — E039 Hypothyroidism, unspecified: Secondary | ICD-10-CM

## 2018-06-10 NOTE — Telephone Encounter (Signed)
Jones. NTBS last TSH 06/06/17. Sent in 30 day RF

## 2018-06-15 ENCOUNTER — Other Ambulatory Visit: Payer: Self-pay | Admitting: Physician Assistant

## 2018-06-15 DIAGNOSIS — K2100 Gastro-esophageal reflux disease with esophagitis, without bleeding: Secondary | ICD-10-CM

## 2018-06-17 ENCOUNTER — Encounter: Payer: Self-pay | Admitting: Physician Assistant

## 2018-06-17 ENCOUNTER — Other Ambulatory Visit: Payer: Self-pay | Admitting: Physician Assistant

## 2018-06-17 DIAGNOSIS — E039 Hypothyroidism, unspecified: Secondary | ICD-10-CM

## 2018-06-17 MED ORDER — LEVOTHYROXINE SODIUM 100 MCG PO TABS: 100.0000 ug | ORAL_TABLET | Freq: Every day | ORAL | 1 refills | Status: DC

## 2018-06-17 NOTE — Telephone Encounter (Signed)
Last lab work was 06-06-2017.   Please advise if we may do a refill on thyroid medication or need to be seen .

## 2018-07-31 ENCOUNTER — Encounter: Payer: Self-pay | Admitting: Physician Assistant

## 2018-08-03 ENCOUNTER — Other Ambulatory Visit: Payer: Self-pay | Admitting: Physician Assistant

## 2018-08-14 ENCOUNTER — Other Ambulatory Visit: Payer: Federal, State, Local not specified - PPO

## 2018-08-14 ENCOUNTER — Other Ambulatory Visit: Payer: Self-pay | Admitting: Physician Assistant

## 2018-08-14 ENCOUNTER — Telehealth: Payer: Self-pay | Admitting: Physician Assistant

## 2018-08-14 ENCOUNTER — Other Ambulatory Visit: Payer: Self-pay

## 2018-08-14 DIAGNOSIS — E039 Hypothyroidism, unspecified: Secondary | ICD-10-CM

## 2018-08-14 DIAGNOSIS — Z0000 Encounter for general adult medical examination without abnormal findings: Secondary | ICD-10-CM

## 2018-08-14 NOTE — Telephone Encounter (Signed)
done

## 2018-08-15 LAB — CMP14+EGFR
ALT: 40 IU/L (ref 0–44)
AST: 29 IU/L (ref 0–40)
Albumin/Globulin Ratio: 2 (ref 1.2–2.2)
Albumin: 4.7 g/dL (ref 3.8–4.9)
Alkaline Phosphatase: 81 IU/L (ref 39–117)
BUN/Creatinine Ratio: 11 (ref 9–20)
BUN: 11 mg/dL (ref 6–24)
Bilirubin Total: 0.6 mg/dL (ref 0.0–1.2)
CO2: 26 mmol/L (ref 20–29)
Calcium: 10.3 mg/dL — ABNORMAL HIGH (ref 8.7–10.2)
Chloride: 99 mmol/L (ref 96–106)
Creatinine, Ser: 0.96 mg/dL (ref 0.76–1.27)
GFR calc Af Amer: 105 mL/min/{1.73_m2} (ref >59)
GFR calc non Af Amer: 91 mL/min/{1.73_m2} (ref >59)
Globulin, Total: 2.4 g/dL (ref 1.5–4.5)
Glucose: 95 mg/dL (ref 65–99)
Potassium: 4 mmol/L (ref 3.5–5.2)
Sodium: 138 mmol/L (ref 134–144)
Total Protein: 7.1 g/dL (ref 6.0–8.5)

## 2018-08-15 LAB — CBC WITH DIFFERENTIAL/PLATELET
Basophils Absolute: 0 10*3/uL (ref 0.0–0.2)
Basos: 1 %
EOS (ABSOLUTE): 0.1 10*3/uL (ref 0.0–0.4)
Eos: 2 %
Hematocrit: 41.1 % (ref 37.5–51.0)
Hemoglobin: 15.1 g/dL (ref 13.0–17.7)
Immature Grans (Abs): 0 10*3/uL (ref 0.0–0.1)
Immature Granulocytes: 0 %
Lymphocytes Absolute: 2 10*3/uL (ref 0.7–3.1)
Lymphs: 38 %
MCH: 31.5 pg (ref 26.6–33.0)
MCHC: 36.7 g/dL — ABNORMAL HIGH (ref 31.5–35.7)
MCV: 86 fL (ref 79–97)
Monocytes Absolute: 0.4 10*3/uL (ref 0.1–0.9)
Monocytes: 7 %
Neutrophils Absolute: 2.7 10*3/uL (ref 1.4–7.0)
Neutrophils: 52 %
Platelets: 185 10*3/uL (ref 150–450)
RBC: 4.79 x10E6/uL (ref 4.14–5.80)
RDW: 13.3 % (ref 11.6–15.4)
WBC: 5.2 10*3/uL (ref 3.4–10.8)

## 2018-08-15 LAB — PSA: Prostate Specific Ag, Serum: 1.2 ng/mL (ref 0.0–4.0)

## 2018-08-15 LAB — TSH: TSH: 5.26 u[IU]/mL — ABNORMAL HIGH (ref 0.450–4.500)

## 2018-08-18 ENCOUNTER — Telehealth: Payer: Self-pay | Admitting: Physician Assistant

## 2018-08-19 ENCOUNTER — Other Ambulatory Visit: Payer: Self-pay

## 2018-08-19 ENCOUNTER — Encounter: Payer: Self-pay | Admitting: Physician Assistant

## 2018-08-19 ENCOUNTER — Ambulatory Visit: Payer: Federal, State, Local not specified - PPO | Admitting: Physician Assistant

## 2018-08-19 VITALS — BP 127/80 | HR 93 | Temp 97.1°F | Ht 72.0 in | Wt 196.8 lb

## 2018-08-19 DIAGNOSIS — K2100 Gastro-esophageal reflux disease with esophagitis, without bleeding: Secondary | ICD-10-CM

## 2018-08-19 DIAGNOSIS — E039 Hypothyroidism, unspecified: Secondary | ICD-10-CM

## 2018-08-19 DIAGNOSIS — Z87828 Personal history of other (healed) physical injury and trauma: Secondary | ICD-10-CM | POA: Diagnosis not present

## 2018-08-19 DIAGNOSIS — K222 Esophageal obstruction: Secondary | ICD-10-CM

## 2018-08-19 DIAGNOSIS — F4310 Post-traumatic stress disorder, unspecified: Secondary | ICD-10-CM | POA: Diagnosis not present

## 2018-08-19 MED ORDER — LEVOTHYROXINE SODIUM 112 MCG PO TABS: 112.0000 ug | ORAL_TABLET | Freq: Every day | ORAL | 3 refills | Status: DC

## 2018-08-19 MED ORDER — OMEPRAZOLE 40 MG PO CPDR: 40.0000 mg | DELAYED_RELEASE_CAPSULE | Freq: Every day | ORAL | 3 refills | Status: DC

## 2018-08-20 NOTE — Progress Notes (Addendum)
BP 127/80   Pulse 93   Temp (!) 97.1 F (36.2 C) (Oral)   Ht 6' (1.829 m)   Wt 196 lb 12.8 oz (89.3 kg)   BMI 26.69 kg/m    Subjective:    Patient ID: Tim Lopez, male    DOB: 09-28-1965, 53 y.o.   MRN: [id number]  HPI: Tim Lopez is a 53 y.o. male presenting on 08/19/2018 for Stress, Post-Traumatic Stress Disorder, and Medication Refill  This patient comes in for periodic recheck on his chronic medical conditions that do include hypothyroidism, posttraumatic stress disorder from head injury and trauma after attack while in the [employer].  We will complete the form for his wounded warrior leave He will be out of work 7/8 through 7/9.  And then he will be allowed another 104 hours/year related to this benefit.  His lab work has been evaluated.  He does need adjustment of his thyroid.  He has not had any other abnormal maladies to his labs.  Past Medical History:  Diagnosis Date  . Depression   . GERD (gastroesophageal reflux disease)   . H/O head injury    jaw fracture, dental injury, skull fracture. 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 Thyroid disease    Relevant past medical, surgical, family and social history reviewed and updated as indicated. Interim medical history since our last visit reviewed. Allergies and medications reviewed and updated. DATA REVIEWED: CHART IN EPIC  Family History reviewed for pertinent findings.  Review of Systems  Constitutional: Negative.  Negative for appetite change and fatigue.  Eyes: Negative for pain and visual disturbance.  Respiratory: Negative.  Negative for cough, chest tightness, shortness of breath and wheezing.   Cardiovascular: Negative.  Negative for chest pain, palpitations and leg swelling.  Gastrointestinal: Negative.  Negative for abdominal pain, diarrhea, nausea and vomiting.  Genitourinary: Negative.   Skin: Negative.  Negative for color change and rash.  Neurological: Positive for  headaches. Negative for weakness and numbness.  Psychiatric/Behavioral: Positive for decreased concentration, dysphoric mood and sleep disturbance. Negative for suicidal ideas. The patient is nervous/anxious.     Allergies as of 08/19/2018      Reactions   Morphine And Related Hives      Medication List       Accurate as of August 19, 2018 11:59 PM. If you have any questions, ask your nurse or doctor.        ibuprofen 800 MG tablet Commonly known as: ADVIL TAKE 1 TABLET BY MOUTH EVERY 8 HOURS AS NEEDED   levothyroxine 112 MCG tablet Commonly known as: Euthyrox Take 1 tablet (112 mcg total) by mouth daily before breakfast. (Needs to be seen before next refill) What changed:   medication strength  how much to take Changed by: Terald Sleeper, PA-C   omeprazole 20 MG capsule Commonly known as: PRILOSEC Take 1 capsule by mouth once daily What changed: Another medication with the same name was added. Make sure you understand how and when to take each. Changed by: Terald Sleeper, PA-C   omeprazole 40 MG capsule Commonly known as: PRILOSEC Take 1 capsule (40 mg total) by mouth daily. What changed: You were already taking a medication with the same name, and this prescription was added. Make sure you understand how and when to take each. Changed by: Terald Sleeper, PA-C          Objective:    BP 127/80   Pulse 93  Temp (!) 97.1 F (36.2 C) (Oral)   Ht 6' (1.829 m)   Wt 196 lb 12.8 oz (89.3 kg)   BMI 26.69 kg/m   Allergies  Allergen Reactions  . Morphine And Related Hives    Wt Readings from Last 3 Encounters:  08/19/18 196 lb 12.8 oz (89.3 kg)  01/21/18 198 lb (89.8 kg)  12/29/17 196 lb 3.2 oz (89 kg)    Physical Exam Vitals signs and nursing note reviewed.  Constitutional:      General: He is not in acute distress.    Appearance: He is well-developed.  HENT:     Head: Normocephalic and atraumatic.  Eyes:     Conjunctiva/sclera: Conjunctivae normal.      Pupils: Pupils are equal, round, and reactive to light.  Cardiovascular:     Rate and Rhythm: Normal rate and regular rhythm.     Heart sounds: Normal heart sounds.  Pulmonary:     Effort: Pulmonary effort is normal. No respiratory distress.     Breath sounds: Normal breath sounds.  Skin:    General: Skin is warm and dry.  Neurological:     Mental Status: He is oriented to person, place, and time.  Psychiatric:        Mood and Affect: Mood is anxious.        Behavior: Behavior normal.     Results for orders placed or performed in visit on 08/14/18  CMP14+EGFR  Result Value Ref Range   Glucose 95 65 - 99 mg/dL   BUN 11 6 - 24 mg/dL   Creatinine, Ser 0.96 0.76 - 1.27 mg/dL   GFR calc non Af Amer 91 >59 mL/min/1.73   GFR calc Af Amer 105 >59 mL/min/1.73   BUN/Creatinine Ratio 11 9 - 20   Sodium 138 134 - 144 mmol/L   Potassium 4.0 3.5 - 5.2 mmol/L   Chloride 99 96 - 106 mmol/L   CO2 26 20 - 29 mmol/L   Calcium 10.3 (H) 8.7 - 10.2 mg/dL   Total Protein 7.1 6.0 - 8.5 g/dL   Albumin 4.7 3.8 - 4.9 g/dL   Globulin, Total 2.4 1.5 - 4.5 g/dL   Albumin/Globulin Ratio 2.0 1.2 - 2.2   Bilirubin Total 0.6 0.0 - 1.2 mg/dL   Alkaline Phosphatase 81 39 - 117 IU/L   AST 29 0 - 40 IU/L   ALT 40 0 - 44 IU/L  CBC with Differential/Platelet  Result Value Ref Range   WBC 5.2 3.4 - 10.8 x10E3/uL   RBC 4.79 4.14 - 5.80 x10E6/uL   Hemoglobin 15.1 13.0 - 17.7 g/dL   Hematocrit 41.1 37.5 - 51.0 %   MCV 86 79 - 97 fL   MCH 31.5 26.6 - 33.0 pg   MCHC 36.7 (H) 31.5 - 35.7 g/dL   RDW 13.3 11.6 - 15.4 %   Platelets 185 150 - 450 x10E3/uL   Neutrophils 52 Not Estab. %   Lymphs 38 Not Estab. %   Monocytes 7 Not Estab. %   Eos 2 Not Estab. %   Basos 1 Not Estab. %   Neutrophils Absolute 2.7 1.4 - 7.0 x10E3/uL   Lymphocytes Absolute 2.0 0.7 - 3.1 x10E3/uL   Monocytes Absolute 0.4 0.1 - 0.9 x10E3/uL   EOS (ABSOLUTE) 0.1 0.0 - 0.4 x10E3/uL   Basophils Absolute 0.0 0.0 - 0.2 x10E3/uL   Immature  Granulocytes 0 Not Estab. %   Immature Grans (Abs) 0.0 0.0 - 0.1 x10E3/uL   Hematology Comments:  Note:   TSH  Result Value Ref Range   TSH 5.260 (H) 0.450 - 4.500 uIU/mL  PSA  Result Value Ref Range   Prostate Specific Ag, Serum 1.2 0.0 - 4.0 ng/mL      Assessment & Plan:   1. Hypothyroidism, unspecified type - levothyroxine (SYNTHROID) 112 MCG tablet; Take 1 tablet (112 mcg total) by mouth daily before breakfast. (Needs to be seen before next refill)  Dispense: 90 tablet; Refill: 3  2. Gastroesophageal reflux disease with esophagitis - omeprazole (PRILOSEC) 40 MG capsule; Take 1 capsule (40 mg total) by mouth daily.  Dispense: 90 capsule; Refill: 3  3. PTSD (post-traumatic stress disorder) Leave form is complete  4. History of traumatic injury of head Leave form is completed  5. Stricture and stenosis of esophagus - omeprazole (PRILOSEC) 40 MG capsule; Take 1 capsule (40 mg total) by mouth daily.  Dispense: 90 capsule; Refill: 3   Continue all other maintenance medications as listed above.  Follow up plan: No follow-ups on file.  Educational handout given for [place] PA-C [facility] [address]  [place], [place] [address] [phone]   08/20/2018, 8:48 PM

## 2018-08-22 ENCOUNTER — Encounter: Payer: Self-pay | Admitting: Physician Assistant

## 2018-10-15 ENCOUNTER — Other Ambulatory Visit: Payer: Self-pay | Admitting: Physician Assistant

## 2018-12-04 ENCOUNTER — Other Ambulatory Visit: Payer: Federal, State, Local not specified - PPO

## 2018-12-04 ENCOUNTER — Other Ambulatory Visit: Payer: Self-pay

## 2018-12-04 DIAGNOSIS — E039 Hypothyroidism, unspecified: Secondary | ICD-10-CM

## 2018-12-05 LAB — TSH: TSH: 1.66 u[IU]/mL (ref 0.450–4.500)

## 2018-12-07 ENCOUNTER — Encounter: Payer: Self-pay | Admitting: Physician Assistant

## 2018-12-07 DIAGNOSIS — E039 Hypothyroidism, unspecified: Secondary | ICD-10-CM

## 2018-12-07 MED ORDER — LEVOTHYROXINE SODIUM 112 MCG PO TABS: 112.0000 ug | ORAL_TABLET | Freq: Every day | ORAL | 3 refills | Status: DC

## 2019-01-15 ENCOUNTER — Other Ambulatory Visit: Payer: Self-pay | Admitting: Physician Assistant

## 2019-01-18 ENCOUNTER — Encounter: Payer: Self-pay | Admitting: Physician Assistant

## 2019-01-21 ENCOUNTER — Other Ambulatory Visit: Payer: Self-pay

## 2019-01-21 ENCOUNTER — Telehealth: Payer: Self-pay | Admitting: Physician Assistant

## 2019-01-22 ENCOUNTER — Encounter: Payer: Self-pay | Admitting: Physician Assistant

## 2019-01-22 ENCOUNTER — Ambulatory Visit: Payer: Federal, State, Local not specified - PPO | Admitting: Physician Assistant

## 2019-01-22 VITALS — BP 138/89 | HR 77 | Temp 96.8°F | Ht 72.0 in | Wt 194.8 lb

## 2019-01-22 DIAGNOSIS — F4310 Post-traumatic stress disorder, unspecified: Secondary | ICD-10-CM

## 2019-01-22 DIAGNOSIS — K2100 Gastro-esophageal reflux disease with esophagitis, without bleeding: Secondary | ICD-10-CM

## 2019-01-22 DIAGNOSIS — Z87828 Personal history of other (healed) physical injury and trauma: Secondary | ICD-10-CM

## 2019-01-22 DIAGNOSIS — R413 Other amnesia: Secondary | ICD-10-CM | POA: Diagnosis not present

## 2019-01-22 DIAGNOSIS — E039 Hypothyroidism, unspecified: Secondary | ICD-10-CM

## 2019-01-25 NOTE — Progress Notes (Signed)
BP 138/89   Pulse 77   Temp (!) 96.8 F (36 C) (Temporal)   Ht 6' (1.829 m)   Wt 194 lb 12.8 oz (88.4 kg)   SpO2 98%   BMI 26.42 kg/m    Subjective:    Patient ID: Tim Lopez, male    DOB: 07/12/1965, 53 y.o.   MRN: [id number]  HPI: Tim Lopez is a 53 y.o. male presenting on 01/22/2019 for Post-Traumatic Stress Disorder (Needs note for work to not wear a face shield )  This patient comes in for recheck on his chronic medical conditions that do include posttraumatic stress disorder, he had an injury as a soldier.  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  He has had more issues in recent weeks this with the stress of everything going on.  He did miss a couple of days and we will have a note for him.  We will also update his forms that he can continue for another year.   Labs have been performed this year and are good and stable.  So he just needs regular refills on some of his maintenance medication.  So he states overall he is doing fairly well with his basic things.   Past Medical History:  Diagnosis Date  . Depression   . GERD (gastroesophageal reflux disease)   . H/O head injury    jaw fracture, dental injury, skull fracture. 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 Thyroid disease    Relevant past medical, surgical, family and social history reviewed and updated as indicated. Interim medical history since our last visit reviewed. Allergies and medications reviewed and updated. DATA REVIEWED: CHART IN EPIC  Family History reviewed for pertinent findings.  Review of Systems  Constitutional: Negative.  Negative for appetite change and fatigue.  Eyes: Negative for pain and visual disturbance.  Respiratory: Negative.  Negative for cough, chest tightness, shortness of breath and wheezing.   Cardiovascular: Negative.  Negative for chest pain, palpitations and leg  swelling.  Gastrointestinal: Negative.  Negative for abdominal pain, diarrhea, nausea and vomiting.  Genitourinary: Negative.   Skin: Negative.  Negative for color change and rash.  Neurological: Negative.  Negative for weakness, numbness and headaches.  Psychiatric/Behavioral: Negative.     Allergies as of 01/22/2019      Reactions   Morphine And Related Hives      Medication List       Accurate as of January 22, 2019 11:59 PM. If you have any questions, ask your nurse or doctor.        ibuprofen 800 MG tablet Commonly known as: ADVIL TAKE 1 TABLET BY MOUTH EVERY 8 HOURS AS NEEDED   levothyroxine 112 MCG tablet Commonly known as: SYNTHROID Take 1 tablet (112 mcg total) by mouth daily before breakfast.   omeprazole 40 MG capsule Commonly known as: PRILOSEC Take 1 capsule (40 mg total) by mouth daily. What changed: Another medication with the same name was removed. Continue taking this medication, and follow the directions you see here. Changed by: Terald Sleeper, PA-C          Objective:    BP 138/89   Pulse 77   Temp (!) 96.8 F (36 C) (Temporal)   Ht 6' (1.829 m)   Wt 194 lb 12.8 oz (88.4 kg)   SpO2 98%   BMI 26.42 kg/m   Allergies  Allergen Reactions  . Morphine And Related Hives    Wt Readings from Last 3 Encounters:  01/22/19 194 lb 12.8 oz (88.4 kg)  08/19/18 196 lb 12.8 oz (89.3 kg)  01/21/18 198 lb (89.8 kg)    Physical Exam Vitals and nursing note reviewed.  Constitutional:      General: He is not in acute distress.    Appearance: He is well-developed.  HENT:     Head: Normocephalic and atraumatic.  Eyes:     Conjunctiva/sclera: Conjunctivae normal.     Pupils: Pupils are equal, round, and reactive to light.  Cardiovascular:     Rate and Rhythm: Normal rate and regular rhythm.     Heart sounds: Normal heart sounds.  Pulmonary:     Effort: Pulmonary effort is normal. No respiratory distress.     Breath sounds: Normal breath sounds.    Skin:    General: Skin is warm and dry.  Psychiatric:        Behavior: Behavior normal.     Results for orders placed or performed in visit on 12/04/18  TSH  Result Value Ref Range   TSH 1.660 0.450 - 4.500 uIU/mL      Assessment & Plan:   1. PTSD (post-traumatic stress disorder) Continue wounded warrior legs Note will be written as needed  2. History of traumatic injury of head Continue wound of lower leg Note will be written as stated  3. Memory change Continue to monitor  4. Gastroesophageal reflux disease with esophagitis without hemorrhage Continue omeprazole 40 mg 1 daily  5. Hypothyroidism, unspecified type Continue levothyroxine 112 mcg.   Continue all other maintenance medications as listed above.  Follow up plan: Return in about 6 months (around 07/23/2019).  Educational handout given for survey  Remus Loffler PA-C [facility] [address]  [place], [place] [address] [phone]   01/25/2019, 10:35 PM

## 2019-02-08 ENCOUNTER — Encounter: Payer: Self-pay | Admitting: Physician Assistant

## 2019-02-11 ENCOUNTER — Encounter: Payer: Self-pay | Admitting: Physician Assistant

## 2019-03-08 ENCOUNTER — Other Ambulatory Visit: Payer: Self-pay | Admitting: Physician Assistant

## 2019-05-20 ENCOUNTER — Other Ambulatory Visit: Payer: Self-pay | Admitting: *Deleted

## 2019-05-20 MED ORDER — IBUPROFEN 800 MG PO TABS: 800.0000 mg | ORAL_TABLET | Freq: Three times a day (TID) | ORAL | 0 refills | Status: DC | PRN

## 2019-07-28 ENCOUNTER — Other Ambulatory Visit: Payer: Self-pay

## 2019-07-28 ENCOUNTER — Encounter: Payer: Self-pay | Admitting: Family Medicine

## 2019-07-28 ENCOUNTER — Ambulatory Visit: Payer: Federal, State, Local not specified - PPO | Admitting: Family Medicine

## 2019-07-28 VITALS — BP 129/84 | HR 89 | Temp 97.8°F | Ht 73.0 in | Wt 199.2 lb

## 2019-07-28 DIAGNOSIS — G4719 Other hypersomnia: Secondary | ICD-10-CM

## 2019-07-28 DIAGNOSIS — E039 Hypothyroidism, unspecified: Secondary | ICD-10-CM

## 2019-07-28 DIAGNOSIS — K2100 Gastro-esophageal reflux disease with esophagitis, without bleeding: Secondary | ICD-10-CM

## 2019-07-28 DIAGNOSIS — R0683 Snoring: Secondary | ICD-10-CM

## 2019-07-28 DIAGNOSIS — F4310 Post-traumatic stress disorder, unspecified: Secondary | ICD-10-CM

## 2019-07-28 DIAGNOSIS — K222 Esophageal obstruction: Secondary | ICD-10-CM

## 2019-07-28 MED ORDER — OMEPRAZOLE 40 MG PO CPDR: 40.0000 mg | DELAYED_RELEASE_CAPSULE | Freq: Every day | ORAL | 1 refills | Status: DC

## 2019-07-28 NOTE — Progress Notes (Signed)
Assessment & Plan:  1. PTSD (post-traumatic stress disorder) - Wounded warrior leave form completed for patient to return to work to cover him for today and earlier this month.  2. Excessive daytime sleepiness - Ambulatory referral to Pulmonology  3. Loud snoring - Ambulatory referral to Pulmonology  4. Gastroesophageal reflux disease with esophagitis without hemorrhage - Well controlled on current regimen.  - omeprazole (PRILOSEC) 40 MG capsule; Take 1 capsule (40 mg total) by mouth daily.  Dispense: 90 capsule; Refill: 1  5. Stricture and stenosis of esophagus - Well controlled on current regimen.  - omeprazole (PRILOSEC) 40 MG capsule; Take 1 capsule (40 mg total) by mouth daily.  Dispense: 90 capsule; Refill: 1  6. Hypothyroidism, unspecified type - Well controlled on current regimen. Last TSH within normal limits approximately 8 months ago.   Return in about 3 months (around 10/28/2019) for annual physical.  Deliah Boston, MSN, APRN, FNP-C [facility]  Subjective:    Patient ID: Tim Lopez, male    DOB: 1966-01-22, 54 y.o.   MRN: [id number]  Patient Care Team: Gwenlyn Fudge, FNP as PCP - General (Family Medicine)   Chief Complaint:  Chief Complaint  Patient presents with  . Establish Care    jones pt  . Medical Management of Chronic Issues    check up of chronic medical conditions  . trouble sleeping    Patient states that it has been ongoing but has gotten worse.    HPI: Tim Lopez is a 54 y.o. male presenting on 07/28/2019 for Establish Care (jones pt), Medical Management of Chronic Issues (check up of chronic medical conditions), and trouble sleeping (Patient states that it has been ongoing but has gotten worse.)  Patient reports he had to miss work June 1-3 due to issues with his PTSD. He does have FMLA that estimates 1 episode per month with each episode lasting up to 7 days. His workplace also offers wounded  warrior leave which allows him to use 104 hours of time through that before using his sick time or PTO. He has a form for the [address] that he needs completed today.  Depression screen [facility] 2/9 07/28/2019 01/22/2019 08/19/2018  Decreased Interest 0 0 0  Down, Depressed, Hopeless 1 1 0  PHQ - 2 Score 1 1 0  Altered sleeping 2 1 -  Tired, decreased energy 1 1 -  Change in appetite 0 0 -  Feeling bad or failure about yourself  0 0 -  Trouble concentrating 1 1 -  Moving slowly or fidgety/restless 0 0 -  Suicidal thoughts 0 0 -  PHQ-9 Score 5 4 -  Difficult doing work/chores - Somewhat difficult -   GAD 7 : Generalized Anxiety Score 07/28/2019 01/22/2019 08/19/2018  Nervous, Anxious, on Edge 1 1 1   Control/stop worrying 1 0 1  Worry too much - different things 1 0 0  Trouble relaxing 1 1 1   Restless 0 0 3  Easily annoyed or irritable 1 2 3   Afraid - awful might happen 0 0 0  Total GAD 7 Score 5 4 9   Anxiety Difficulty - Somewhat difficult Somewhat difficult    Patient also reports trouble sleeping that has been getting worse. He was referred to neurology 3 years ago for a sleep study, but this was not completed. He is requesting a new referral today to have the sleep study completed close to home. He has tried over-the-counter products such as melatonin and  Unisom to help him sleep, which were not effective. He is not interested in medication for sleep.  Height: 6\' 1"  (1.854 m)  Weight: 199 lbs BMI: Body mass index is 26.28 kg/m.  Neck circumference: 42 cm  1. Do you snore loudly (louder than talking or loud enough to be heard through closed doors)?  Yes 2. Do you often feel tired, fatigued, or sleepy during daytime?  Yes 3. Has anyone observed you stop breathing during your sleep?  No 4. Do you have or are you being treated for high blood pressure?  No 5. BMI more than 35 kg/m2?  No 6. Age over 50 years?  Yes 7. Neck circumference greater than 40 cm?  Yes 8. Male  gender?  Yes  Total "Yes" Answers: 5   Social history:  Relevant past medical, surgical, family and social history reviewed and updated as indicated. Interim medical history since our last visit reviewed.  Allergies and medications reviewed and updated.  DATA REVIEWED: CHART IN EPIC  ROS: Negative unless specifically indicated above in HPI.    Current Outpatient Medications:  .  ibuprofen (ADVIL) 800 MG tablet, Take 1 tablet (800 mg total) by mouth every 8 (eight) hours as needed., Disp: 90 tablet, Rfl: 0 .  levothyroxine (SYNTHROID) 112 MCG tablet, Take 1 tablet (112 mcg total) by mouth daily before breakfast., Disp: 90 tablet, Rfl: 3 .  omeprazole (PRILOSEC) 40 MG capsule, Take 1 capsule (40 mg total) by mouth daily., Disp: 90 capsule, Rfl: 1   Allergies  Allergen Reactions  . Morphine And Related Hives   Past Medical History:  Diagnosis Date  . Depression   . GERD (gastroesophageal reflux disease)   . H/O head injury    jaw fracture, dental injury, skull fracture. 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 Thyroid disease     History reviewed. No pertinent surgical history.  Social History   Socioeconomic History  . Marital status: Married    Spouse name: Not on file  . Number of children: Not on file  . Years of education: Not on file  . Highest education level: Not on file  Occupational History  . Not on file  Tobacco Use  . Smoking status: Never Smoker  . Smokeless tobacco: Never Used  Vaping Use  . Vaping Use: Never used  Substance and Sexual Activity  . Alcohol use: No  . Drug use: No  . Sexual activity: Not on file  Other Topics Concern  . Not on file  Social History Narrative  . Not on file   Social Determinants of Health   Financial Resource Strain:   . Difficulty of Paying Living Expenses:   Food Insecurity:   . Worried About in the Last Year:   . Programme researcher, broadcasting/film/video in the Last Year:   Transportation Needs:    . Barista (Medical):   Freight forwarder Lack of Transportation (Non-Medical):   Physical Activity:   . Days of Exercise per Week:   . Minutes of Exercise per Session:   Stress:   . Feeling of Stress :   Social Connections:   . Frequency of Communication with Friends and Family:   . Frequency of Social Gatherings with Friends and Family:   . Attends Religious Services:   . Active Member of Clubs or Organizations:   . Attends [employer] Meetings:   Banker Marital Status:   Intimate Partner Violence:   . Fear  of Current or Ex-Partner:   . Emotionally Abused:   [employer] Physically Abused:   . Sexually Abused:         Objective:    BP 129/84   Pulse 89   Temp 97.8 F (36.6 C) (Temporal)   Ht 6\' 1"  (1.854 m)   Wt 199 lb 3.2 oz (90.4 kg)   SpO2 98%   BMI 26.28 kg/m   Wt Readings from Last 3 Encounters:  07/28/19 199 lb 3.2 oz (90.4 kg)  01/22/19 194 lb 12.8 oz (88.4 kg)  08/19/18 196 lb 12.8 oz (89.3 kg)    Physical Exam Vitals reviewed.  Constitutional:      General: He is not in acute distress.    Appearance: Normal appearance. He is overweight. He is not ill-appearing, toxic-appearing or diaphoretic.  HENT:     Head: Normocephalic and atraumatic.  Eyes:     General: No scleral icterus.       Right eye: No discharge.        Left eye: No discharge.     Conjunctiva/sclera: Conjunctivae normal.  Cardiovascular:     Rate and Rhythm: Normal rate and regular rhythm.     Heart sounds: Normal heart sounds. No murmur heard.  No friction rub. No gallop.   Pulmonary:     Effort: Pulmonary effort is normal. No respiratory distress.     Breath sounds: Normal breath sounds. No stridor. No wheezing, rhonchi or rales.  Musculoskeletal:        General: Normal range of motion.     Cervical back: Normal range of motion.  Skin:    General: Skin is warm and dry.  Neurological:     Mental Status: He is alert and oriented to person, place, and time. Mental status is at baseline.   Psychiatric:        Mood and Affect: Mood normal.        Behavior: Behavior normal.        Thought Content: Thought content normal.        Judgment: Judgment normal.     Lab Results  Component Value Date   TSH 1.660 12/04/2018   Lab Results  Component Value Date   WBC 5.2 08/14/2018   HGB 15.1 08/14/2018   HCT 41.1 08/14/2018   MCV 86 08/14/2018   PLT 185 08/14/2018   Lab Results  Component Value Date   NA 138 08/14/2018   K 4.0 08/14/2018   CO2 26 08/14/2018   GLUCOSE 95 08/14/2018   BUN 11 08/14/2018   CREATININE 0.96 08/14/2018   BILITOT 0.6 08/14/2018   ALKPHOS 81 08/14/2018   AST 29 08/14/2018   ALT 40 08/14/2018   PROT 7.1 08/14/2018   ALBUMIN 4.7 08/14/2018   CALCIUM 10.3 (H) 08/14/2018   No results found for: CHOL No results found for: HDL No results found for: LDLCALC No results found for: TRIG No results found for: CHOLHDL No results found for: HGBA1C

## 2019-08-04 ENCOUNTER — Other Ambulatory Visit: Payer: Self-pay | Admitting: Family Medicine

## 2019-08-04 NOTE — Telephone Encounter (Signed)
Last rx'd 05/2019 by Dr Dettinger Last CMP on file 08/2018 Last OV 07/28/19 Ok to refill?

## 2019-09-23 ENCOUNTER — Other Ambulatory Visit: Payer: Self-pay

## 2019-09-23 ENCOUNTER — Encounter: Payer: Self-pay | Admitting: Pulmonary Disease

## 2019-09-23 ENCOUNTER — Ambulatory Visit (INDEPENDENT_AMBULATORY_CARE_PROVIDER_SITE_OTHER): Payer: Federal, State, Local not specified - PPO | Admitting: Pulmonary Disease

## 2019-09-23 VITALS — BP 160/102 | HR 79 | Temp 99.2°F | Ht 72.0 in | Wt 195.0 lb

## 2019-09-23 DIAGNOSIS — R0683 Snoring: Secondary | ICD-10-CM

## 2019-09-23 NOTE — Patient Instructions (Signed)
Will arrange for home sleep study Will call to arrange for follow up after sleep study reviewed  

## 2019-09-23 NOTE — Progress Notes (Signed)
[facility]  Chief Complaint  Patient presents with  . Consult    Patient snores loudly, wakes up 2-3 times a night, wakes himself up snoring    Constitutional:  BP (!) 160/102 (BP Location: Left Arm, Patient Position: Sitting, Cuff Size: Normal)   Pulse 79   Temp 99.2 F (37.3 C) (Oral)   Ht 6' (1.829 m)   Wt 195 lb (88.5 kg)   SpO2 96%   BMI 26.45 kg/m   Past Medical History:  Depression, GERD, Head injury in Military, Hypothyroidism  Summary:  Tim Lopez is a 54 y.o. male with snoring.  Subjective:   He was in [employer].  He had head injury in September 1991.  He had facial fracture.  He didn't have any sleep issues prior to this.  Since then he has noticed problems with his sleep.  He also gets jaw clicking when chewing, and can "pop" his jaw.  His wife says he snores and will stop breathing at times while asleep.  He will get vivid dreams, and his wife says he will talk and scream during the dream before he wakes up.  He sleeps mostly on his back.  He will get funny feeling in his legs at times at night, but not too much trouble.  He falls asleep frequently when he is watching TV or riding as a passenger in the car.  He goes to sleep at 9 pm.  He falls asleep in 15 minutes.  He wakes up 2 times to use the bathroom.  He gets out of bed at 345 am to get ready for work.  He works in the [employer].  He feels okay in the morning.  He denies morning headache.  He does not use anything to help him fall sleep or stay awake.  He denies sleep walking or bruxism.  He denies sleep hallucinations, sleep paralysis, or cataplexy.  The Epworth score is 18 out of 24.   Physical Exam:   Appearance - well kempt  ENMT - no sinus tenderness, no nasal discharge, no oral exudate, Mallampati 3, under bite, click with jaw opening at TMJ, low laying soft palate, elongated uvula  Respiratory - no wheeze, or rales  CV - regular rate and  rhythm, no murmurs  GI - soft, non tender  Lymph - no adenopathy noted in neck  Ext - no edema  Skin - no rashes  Neuro - normal strength, oriented x 3  Psych - normal mood and affect  Discussion:  He has snoring, sleep disruption, daytime sleepiness, and witnessed apnea.  He has history of depression.  His sleep issues seemed to have developed and progressed after he had head injury with facial fracture while in [employer].  I am concerned he could have obstructive sleep apnea.  Assessment/Plan:   Snoring with excessive daytime sleepiness. - will need to arrange for a home sleep study  Cardiovascular risk. - had an extensive discussion regarding the adverse health consequences related to untreated sleep disordered breathing - specifically discussed the risks for hypertension, coronary artery disease, cardiac dysrhythmias, cerebrovascular disease, and diabetes - lifestyle modification discussed  Safe driving practices. - discussed how sleep disruption can increase risk of accidents, particularly when driving - safe driving practices were discussed  Therapies for obstructive sleep apnea. - if the sleep study shows significant sleep apnea, then various therapies for treatment were reviewed: CPAP, oral appliance, and surgical interventions  Elevated blood pressure. -  his blood pressure was elevated today - review of medical record doesn't show issues with blood pressure before - he will follow up with his PCP for continued monitoring of this  A total of  32 minutes spent addressing patient care issues on day of visit.  Follow up:  Patient Instructions  Will arrange for home sleep study Will call to arrange for follow up after sleep study reviewed   Signature:  Coralyn Helling, MD [facility] Pager: [phone] 09/23/2019, 9:32 AM  Flow Sheet    Sleep tests:    Medications:   Allergies as of 09/23/2019      Reactions   Morphine And  Related Hives      Medication List       Accurate as of September 23, 2019  9:32 AM. If you have any questions, ask your nurse or doctor.        ibuprofen 800 MG tablet Commonly known as: ADVIL TAKE 1 TABLET BY MOUTH EVERY 8 HOURS AS NEEDED   levothyroxine 112 MCG tablet Commonly known as: SYNTHROID Take 1 tablet (112 mcg total) by mouth daily before breakfast.   omeprazole 40 MG capsule Commonly known as: PRILOSEC Take 1 capsule (40 mg total) by mouth daily.       Past Surgical History:  He  has no past surgical history on file.  Family History:  His family history includes Arthritis in his father; Diabetes in his father; Hyperlipidemia in his father; Hypertension in his father.  Social History:  He  reports that he has never smoked. He has never used smokeless tobacco. He reports that he does not drink alcohol and does not use drugs.

## 2019-10-28 ENCOUNTER — Other Ambulatory Visit: Payer: Self-pay

## 2019-10-28 ENCOUNTER — Ambulatory Visit: Payer: Federal, State, Local not specified - PPO

## 2019-10-28 DIAGNOSIS — G4733 Obstructive sleep apnea (adult) (pediatric): Secondary | ICD-10-CM | POA: Diagnosis not present

## 2019-10-28 DIAGNOSIS — R0683 Snoring: Secondary | ICD-10-CM

## 2019-11-02 ENCOUNTER — Telehealth: Payer: Self-pay | Admitting: Pulmonary Disease

## 2019-11-02 DIAGNOSIS — G4733 Obstructive sleep apnea (adult) (pediatric): Secondary | ICD-10-CM | POA: Diagnosis not present

## 2019-11-02 NOTE — Telephone Encounter (Signed)
HST 10/28/19 >> AHI 27.9, SpO2 low 79%.    Please inform him that his sleep study shows moderate obstructive sleep apnea.  Please arrange for ROV with me or NP to discuss treatment options.

## 2019-11-02 NOTE — Telephone Encounter (Signed)
Called and spoke to patient about HST results per Dr Craige Cotta. Patient scheduled follow up office visit with NP for Thursday 11/04/2019 at 2pm in [place]. Address provided. All questions answered and patient expressed understanding. Nothing further needed at this time.

## 2019-11-04 ENCOUNTER — Encounter: Payer: Self-pay | Admitting: Adult Health

## 2019-11-04 ENCOUNTER — Other Ambulatory Visit: Payer: Self-pay

## 2019-11-04 ENCOUNTER — Ambulatory Visit (INDEPENDENT_AMBULATORY_CARE_PROVIDER_SITE_OTHER): Payer: Federal, State, Local not specified - PPO | Admitting: Adult Health

## 2019-11-04 VITALS — BP 140/84 | HR 87 | Temp 98.5°F | Ht 72.0 in | Wt 201.6 lb

## 2019-11-04 DIAGNOSIS — G4733 Obstructive sleep apnea (adult) (pediatric): Secondary | ICD-10-CM | POA: Diagnosis not present

## 2019-11-04 NOTE — Patient Instructions (Signed)
Begin CPAP at bedtime Try to wear your CPAP all night long need to wear at least 4 hours or more Work on healthy weight Do not drive if sleepy Follow-up in 2 to 3 months with Dr. Craige Cotta and As needed

## 2019-11-04 NOTE — Progress Notes (Signed)
@Tim Lopez  ID: , male    DOB: 1965/04/06, 54 y.o.   MRN: [id number]  Chief Complaint  Tim Lopez presents with  . Follow-up    OSA     Referring provider: [id number], FNP  HPI: 54 year old male seen for sleep consult September 23, 2019 for daytime sleepiness and snoring. 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  11/04/2019 Follow up : OSA  Tim Lopez presents for a 1 month follow-up. Tim Lopez was seen last visit for sleep consult for daytime sleepiness and snoring. Tim Lopez was set up for home sleep study that was done on October 28, 2019 that showed moderate sleep apnea with AHI at 27.9/hour. SPO2 low at 79%. We discussed his sleep study results. Went over potential treatment options including weight loss, oral appliance and CPAP. Tim Lopez would like to proceed with a CPAP. Has significant daytime sleepiness .   Allergies  Allergen Reactions  . Morphine And Related Hives     There is no immunization history on file for this Tim Lopez.  Past Medical History:  Diagnosis Date  . Depression   . GERD (gastroesophageal reflux disease)   . H/O head injury    jaw fracture, dental injury, skull fracture. 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 Thyroid disease     Tobacco History: Social History   Tobacco Use  Smoking Status Never Smoker  Smokeless Tobacco Never Used   Counseling given: Not Answered   Outpatient Medications Prior to Visit  Medication Sig Dispense Refill  . ibuprofen (ADVIL) 800 MG tablet TAKE 1 TABLET BY MOUTH EVERY 8 HOURS AS NEEDED 90 tablet 2  . levothyroxine (SYNTHROID) 112 MCG tablet Take 1 tablet (112 mcg total) by mouth daily before breakfast. 90 tablet 3  . omeprazole (PRILOSEC) 40 MG capsule Take 1 capsule (40 mg total) by mouth daily. 90 capsule 1   No facility-administered medications prior  to visit.     Review of Systems:   Constitutional:   No  weight loss, night sweats,  Fevers, chills, fatigue, or  lassitude.  HEENT:   No headaches,  Difficulty swallowing,  Tooth/dental problems, or  Sore throat,                No sneezing, itching, ear ache, nasal congestion, post nasal drip,   CV:  No chest pain,  Orthopnea, PND, swelling in lower extremities, anasarca, dizziness, palpitations, syncope.   GI  No heartburn, indigestion, abdominal pain, nausea, vomiting, diarrhea, change in bowel habits, loss of appetite, bloody stools.   Resp: No shortness of breath with exertion or at rest.  No excess mucus, no productive cough,  No non-productive cough,  No coughing up of blood.  No change in color of mucus.  No wheezing.  No chest wall deformity  Skin: no rash or lesions.  GU: no dysuria, change in color of urine, no urgency or frequency.  No flank pain, no hematuria   MS:  No joint pain or swelling.  No decreased range of motion.  No back pain.    Physical Exam  BP 140/84 (BP Location: Left Arm, Cuff Size: Normal)   Pulse 87   Temp 98.5 F (36.9 C) (Temporal)   Ht 6' (1.829 m)   Wt 201 lb 9.6 oz (91.4 kg)   SpO2 98% Comment: RA  BMI 27.34 kg/m   GEN:  A/Ox3; pleasant , NAD, well nourished    HEENT:  [place]/AT,  NOSE-clear, THROAT-clear, no lesions, no postnasal drip or exudate noted.   NECK:  Supple w/ fair ROM; no JVD; normal carotid impulses w/o bruits; no thyromegaly or nodules palpated; no lymphadenopathy.    RESP  Clear  P & A; w/o, wheezes/ rales/ or rhonchi. no accessory muscle use, no dullness to percussion  CARD:  RRR, no m/r/g, no peripheral edema, pulses intact, no cyanosis or clubbing.  GI:   Soft & nt; nml bowel sounds; no organomegaly or masses detected.   Musco: Warm bil, no deformities or joint swelling noted.   Neuro: alert, no focal deficits noted.    Skin: Warm, no lesions or rashes    Lab Results:  CBC  BNP No results found for:  BNP  ProBNP No results found for: PROBNP  Imaging: No results found.    No flowsheet data found.  No results found for: NITRICOXIDE      Assessment & Plan:   OSA (obstructive sleep apnea) Moderate obstructive sleep apnea-Tim Lopez education on underlying sleep apnea.  Tim Lopez would like to proceed with CPAP.  We went over Tim Lopez education on CPAP usage.  Tim Lopez would like to try a smaller mass not a full facemask.  We discussed a DreamWear nasal mask.  Plan  Tim Lopez Instructions  Begin CPAP at bedtime Try to wear your CPAP all night long need to wear at least 4 hours or more Work on healthy weight Do not drive if sleepy Follow-up in 2 to 3 months with Dr. Craige Cotta and As needed          Rubye Oaks, NP 11/04/2019

## 2019-11-04 NOTE — Assessment & Plan Note (Signed)
Moderate obstructive sleep apnea-patient education on underlying sleep apnea.  Patient would like to proceed with CPAP.  We went over patient education on CPAP usage.  Patient would like to try a smaller mass not a full facemask.  We discussed a DreamWear nasal mask.  Plan  Patient Instructions  Begin CPAP at bedtime Try to wear your CPAP all night long need to wear at least 4 hours or more Work on healthy weight Do not drive if sleepy Follow-up in 2 to 3 months with Dr. Craige Cotta and As needed

## 2019-11-05 NOTE — Progress Notes (Signed)
Reviewed and agree with assessment/plan.   Coralyn Helling, MD [facility] 11/05/2019, 7:23 AM Pager:  [phone]

## 2019-11-29 ENCOUNTER — Telehealth: Payer: Self-pay | Admitting: Adult Health

## 2019-11-30 NOTE — Telephone Encounter (Addendum)
Called Brad at [employer].  He states pt was called on 10/20 by a Gardiner Ramus and told may be 30 days before he can get machine.  A couple of attempts had been made previously to pt but couldn't get thru.  Nida Boatman states they are getting in machines but they have such a long list of pt's waiting on them.  I called pt &  he states he didn't get a call on 10/20.  I made him aware of cpap delay at all dme's and gave him [employer]'s customer service phone # so he can call & check when he wants.  Nothing further needed on this message.

## 2019-12-03 ENCOUNTER — Other Ambulatory Visit: Payer: Self-pay | Admitting: *Deleted

## 2019-12-03 DIAGNOSIS — E039 Hypothyroidism, unspecified: Secondary | ICD-10-CM

## 2019-12-03 MED ORDER — LEVOTHYROXINE SODIUM 112 MCG PO TABS: 112.0000 ug | ORAL_TABLET | Freq: Every day | ORAL | 0 refills | Status: DC

## 2020-01-07 ENCOUNTER — Ambulatory Visit: Payer: Federal, State, Local not specified - PPO | Admitting: Pulmonary Disease

## 2020-02-24 ENCOUNTER — Encounter: Payer: Federal, State, Local not specified - PPO | Admitting: Family Medicine

## 2020-03-04 ENCOUNTER — Other Ambulatory Visit: Payer: Self-pay | Admitting: Family Medicine

## 2020-03-11 ENCOUNTER — Other Ambulatory Visit: Payer: Self-pay | Admitting: Family Medicine

## 2020-03-11 DIAGNOSIS — E039 Hypothyroidism, unspecified: Secondary | ICD-10-CM

## 2020-03-31 ENCOUNTER — Other Ambulatory Visit: Payer: Self-pay

## 2020-03-31 ENCOUNTER — Ambulatory Visit: Payer: Federal, State, Local not specified - PPO | Admitting: Family Medicine

## 2020-03-31 ENCOUNTER — Encounter: Payer: Self-pay | Admitting: Family Medicine

## 2020-03-31 VITALS — BP 138/93 | HR 70 | Temp 97.3°F | Ht 73.0 in | Wt 190.2 lb

## 2020-03-31 DIAGNOSIS — K2100 Gastro-esophageal reflux disease with esophagitis, without bleeding: Secondary | ICD-10-CM

## 2020-03-31 DIAGNOSIS — E039 Hypothyroidism, unspecified: Secondary | ICD-10-CM | POA: Diagnosis not present

## 2020-03-31 DIAGNOSIS — K222 Esophageal obstruction: Secondary | ICD-10-CM

## 2020-03-31 DIAGNOSIS — I10 Essential (primary) hypertension: Secondary | ICD-10-CM

## 2020-03-31 DIAGNOSIS — G4733 Obstructive sleep apnea (adult) (pediatric): Secondary | ICD-10-CM

## 2020-03-31 MED ORDER — PANTOPRAZOLE SODIUM 40 MG PO TBEC: 40.0000 mg | DELAYED_RELEASE_TABLET | Freq: Every day | ORAL | 2 refills | Status: DC

## 2020-03-31 MED ORDER — LEVOTHYROXINE SODIUM 112 MCG PO TABS: 112.0000 ug | ORAL_TABLET | Freq: Every day | ORAL | 1 refills | Status: DC

## 2020-03-31 NOTE — Progress Notes (Signed)
Assessment & Plan:  1. Hypothyroidism, unspecified type Well controlled on current regimen.  - levothyroxine (EUTHYROX) 112 MCG tablet; Take 1 tablet (112 mcg total) by mouth daily before breakfast.  Dispense: 90 tablet; Refill: 1 - Thyroid Panel With TSH  2. Gastroesophageal reflux disease with esophagitis without hemorrhage Switching from omeprazole to Protonix to see if it resolves the stinging pain in his abdomen. - pantoprazole (PROTONIX) 40 MG tablet; Take 1 tablet (40 mg total) by mouth daily.  Dispense: 30 tablet; Refill: 2 - CMP14+EGFR  3. Stricture and stenosis of esophagus Switching from omeprazole to Protonix to see if it resolves the stinging pain in his abdomen. - pantoprazole (PROTONIX) 40 MG tablet; Take 1 tablet (40 mg total) by mouth daily.  Dispense: 30 tablet; Refill: 2 - CMP14+EGFR  4. OSA (obstructive sleep apnea) Managed by pulmonology.  5. Essential hypertension Patient declines medication to treat.  Encouraged diet and exercise.  Education provided on the DASH diet. - CBC with Differential/Platelet - CMP14+EGFR - Lipid panel   Return in about 3 months (around 06/28/2020) for HTN.  Hendricks Limes, MSN, APRN, FNP-C [facility]  Subjective:    Patient ID: Tim Lopez, male    DOB: 07-04-1965, 55 y.o.   MRN: [id number]  Patient Care Team: Loman Brooklyn, FNP as PCP - General (Family Medicine)   Chief Complaint:  Chief Complaint  Patient presents with  . Hypothyroidism  . Gastroesophageal Reflux    Check up of chronic medical conditions.      HPI: Tim Lopez is a 55 y.o. male presenting on 03/31/2020 for Hypothyroidism and Gastroesophageal Reflux (Check up of chronic medical conditions. /)  GERD: Patient reports he has a stinging pain in his abdomen when he takes omeprazole.  If he does not take it he is choking on his food.  Hypertension: Patient has had multiple elevated blood pressure readings over  the past 6 months.  He does eat a high salt content in his foods.  States he eats ham sandwiches 4 days a week.   New complaints: None  Social history:  Relevant past medical, surgical, family and social history reviewed and updated as indicated. Interim medical history since our last visit reviewed.  Allergies and medications reviewed and updated.  DATA REVIEWED: CHART IN EPIC  ROS: Negative unless specifically indicated above in HPI.    Current Outpatient Medications:  .  ibuprofen (ADVIL) 800 MG tablet, TAKE 1 TABLET BY MOUTH EVERY 8 HOURS AS NEEDED, Disp: 90 tablet, Rfl: 0 .  levothyroxine (EUTHYROX) 112 MCG tablet, Take 1 tablet (112 mcg total) by mouth daily before breakfast. (Needs to be seen before next refill), Disp: 30 tablet, Rfl: 0 .  omeprazole (PRILOSEC) 40 MG capsule, Take 1 capsule (40 mg total) by mouth daily., Disp: 90 capsule, Rfl: 1   Allergies  Allergen Reactions  . Morphine And Related Hives   Past Medical History:  Diagnosis Date  . Depression   . GERD (gastroesophageal reflux disease)   . H/O head injury    jaw fracture, dental injury, skull fracture. 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 Thyroid disease     History reviewed. No pertinent surgical history.  Social History   Socioeconomic History  . Marital status: Married    Spouse name: Not on file  . Number of children: Not on file  . Years of education: Not on file  . Highest education level: Not on file  Occupational  History  . Not on file  Tobacco Use  . Smoking status: Never Smoker  . Smokeless tobacco: Never Used  Vaping Use  . Vaping Use: Never used  Substance and Sexual Activity  . Alcohol use: No  . Drug use: No  . Sexual activity: Not on file  Other Topics Concern  . Not on file  Social History Narrative  . Not on file   Social Determinants of Health   Financial Resource Strain: Not on file  Food Insecurity: Not on file  Transportation Needs: Not on  file  Physical Activity: Not on file  Stress: Not on file  Social Connections: Not on file  Intimate Partner Violence: Not on file        Objective:    BP (!) 138/93   Pulse 70   Temp (!) 97.3 F (36.3 C) (Temporal)   Ht '6\' 1"'  (1.854 m)   Wt 190 lb 3.2 oz (86.3 kg)   SpO2 100%   BMI 25.09 kg/m   Wt Readings from Last 3 Encounters:  03/31/20 190 lb 3.2 oz (86.3 kg)  11/04/19 201 lb 9.6 oz (91.4 kg)  09/23/19 195 lb (88.5 kg)    Physical Exam Vitals reviewed.  Constitutional:      General: He is not in acute distress.    Appearance: Normal appearance. He is normal weight. He is not ill-appearing, toxic-appearing or diaphoretic.  HENT:     Head: Normocephalic and atraumatic.  Eyes:     General: No scleral icterus.       Right eye: No discharge.        Left eye: No discharge.     Conjunctiva/sclera: Conjunctivae normal.  Cardiovascular:     Rate and Rhythm: Normal rate and regular rhythm.     Heart sounds: Normal heart sounds. No murmur heard. No friction rub. No gallop.   Pulmonary:     Effort: Pulmonary effort is normal. No respiratory distress.     Breath sounds: Normal breath sounds. No stridor. No wheezing, rhonchi or rales.  Musculoskeletal:        General: Normal range of motion.     Cervical back: Normal range of motion.  Skin:    General: Skin is warm and dry.  Neurological:     Mental Status: He is alert and oriented to person, place, and time. Mental status is at baseline.  Psychiatric:        Mood and Affect: Mood normal.        Behavior: Behavior normal.        Thought Content: Thought content normal.        Judgment: Judgment normal.     Lab Results  Component Value Date   TSH 1.660 12/04/2018   Lab Results  Component Value Date   WBC 5.2 08/14/2018   HGB 15.1 08/14/2018   HCT 41.1 08/14/2018   MCV 86 08/14/2018   PLT 185 08/14/2018   Lab Results  Component Value Date   NA 138 08/14/2018   K 4.0 08/14/2018   CO2 26 08/14/2018    GLUCOSE 95 08/14/2018   BUN 11 08/14/2018   CREATININE 0.96 08/14/2018   BILITOT 0.6 08/14/2018   ALKPHOS 81 08/14/2018   AST 29 08/14/2018   ALT 40 08/14/2018   PROT 7.1 08/14/2018   ALBUMIN 4.7 08/14/2018   CALCIUM 10.3 (H) 08/14/2018   No results found for: CHOL No results found for: HDL No results found for: LDLCALC No results found for: TRIG  No results found for: CHOLHDL No results found for: HGBA1C

## 2020-03-31 NOTE — Patient Instructions (Signed)
DASH Eating Plan DASH stands for Dietary Approaches to Stop Hypertension. The DASH eating plan is a healthy eating plan that has been shown to:  Reduce high blood pressure (hypertension).  Reduce your risk for type 2 diabetes, heart disease, and stroke.  Help with weight loss. What are tips for following this plan? Reading food labels  Check food labels for the amount of salt (sodium) per serving. Choose foods with less than 5 percent of the Daily Value of sodium. Generally, foods with less than 300 milligrams (mg) of sodium per serving fit into this eating plan.  To find whole grains, look for the word "whole" as the first word in the ingredient list. Shopping  Buy products labeled as "low-sodium" or "no salt added."  Buy fresh foods. Avoid canned foods and pre-made or frozen meals. Cooking  Avoid adding salt when cooking. Use salt-free seasonings or herbs instead of table salt or sea salt. Check with your health care provider or pharmacist before using salt substitutes.  Do not fry foods. Cook foods using healthy methods such as baking, boiling, grilling, roasting, and broiling instead.  Cook with heart-healthy oils, such as olive, canola, avocado, soybean, or sunflower oil. Meal planning  Eat a balanced diet that includes: ? 4 or more servings of fruits and 4 or more servings of vegetables each day. Try to fill one-half of your plate with fruits and vegetables. ? 6-8 servings of whole grains each day. ? Less than 6 oz (170 g) of lean meat, poultry, or fish each day. A 3-oz (85-g) serving of meat is about the same size as a deck of cards. One egg equals 1 oz (28 g). ? 2-3 servings of low-fat dairy each day. One serving is 1 cup (237 mL). ? 1 serving of nuts, seeds, or beans 5 times each week. ? 2-3 servings of heart-healthy fats. Healthy fats called omega-3 fatty acids are found in foods such as walnuts, flaxseeds, fortified milks, and eggs. These fats are also found in  cold-water fish, such as sardines, salmon, and mackerel.  Limit how much you eat of: ? Canned or prepackaged foods. ? Food that is high in trans fat, such as some fried foods. ? Food that is high in saturated fat, such as fatty meat. ? Desserts and other sweets, sugary drinks, and other foods with added sugar. ? Full-fat dairy products.  Do not salt foods before eating.  Do not eat more than 4 egg yolks a week.  Try to eat at least 2 vegetarian meals a week.  Eat more home-cooked food and less restaurant, buffet, and fast food.   Lifestyle  When eating at a restaurant, ask that your food be prepared with less salt or no salt, if possible.  If you drink alcohol: ? Limit how much you use to:  0-1 drink a day for women who are not pregnant.  0-2 drinks a day for men. ? Be aware of how much alcohol is in your drink. In the [place] one drink equals one 12 oz bottle of beer (355 mL), one 5 oz glass of wine (148 mL), or one 1 oz glass of hard liquor (44 mL). General information  Avoid eating more than 2,300 mg of salt a day. If you have hypertension, you may need to reduce your sodium intake to 1,500 mg a day.  Work with your health care provider to maintain a healthy body weight or to lose weight. Ask what an ideal weight is for you.    Get at least 30 minutes of exercise that causes your heart to beat faster (aerobic exercise) most days of the week. Activities may include walking, swimming, or biking.  Work with your health care provider or dietitian to adjust your eating plan to your individual calorie needs. What foods should I eat? Fruits All fresh, dried, or frozen fruit. Canned fruit in natural juice (without added sugar). Vegetables Fresh or frozen vegetables (raw, steamed, roasted, or grilled). Low-sodium or reduced-sodium tomato and vegetable juice. Low-sodium or reduced-sodium tomato sauce and tomato paste. Low-sodium or reduced-sodium canned vegetables. Grains Whole-grain  or whole-wheat bread. Whole-grain or whole-wheat pasta. Brown rice. Oatmeal. Quinoa. Bulgur. Whole-grain and low-sodium cereals. Pita bread. Low-fat, low-sodium crackers. Whole-wheat flour tortillas. Meats and other proteins Skinless chicken or turkey. Ground chicken or turkey. Pork with fat trimmed off. Fish and seafood. Egg whites. Dried beans, peas, or lentils. Unsalted nuts, nut butters, and seeds. Unsalted canned beans. Lean cuts of beef with fat trimmed off. Low-sodium, lean precooked or cured meat, such as sausages or meat loaves. Dairy Low-fat (1%) or fat-free (skim) milk. Reduced-fat, low-fat, or fat-free cheeses. Nonfat, low-sodium ricotta or cottage cheese. Low-fat or nonfat yogurt. Low-fat, low-sodium cheese. Fats and oils Soft margarine without trans fats. Vegetable oil. Reduced-fat, low-fat, or light mayonnaise and salad dressings (reduced-sodium). Canola, safflower, olive, avocado, soybean, and sunflower oils. Avocado. Seasonings and condiments Herbs. Spices. Seasoning mixes without salt. Other foods Unsalted popcorn and pretzels. Fat-free sweets. The items listed above may not be a complete list of foods and beverages you can eat. Contact a dietitian for more information. What foods should I avoid? Fruits Canned fruit in a light or heavy syrup. Fried fruit. Fruit in cream or butter sauce. Vegetables Creamed or fried vegetables. Vegetables in a cheese sauce. Regular canned vegetables (not low-sodium or reduced-sodium). Regular canned tomato sauce and paste (not low-sodium or reduced-sodium). Regular tomato and vegetable juice (not low-sodium or reduced-sodium). Pickles. Olives. Grains Baked goods made with fat, such as croissants, muffins, or some breads. Dry pasta or rice meal packs. Meats and other proteins Fatty cuts of meat. Ribs. Fried meat. Bacon. Bologna, salami, and other precooked or cured meats, such as sausages or meat loaves. Fat from the back of a pig (fatback).  Bratwurst. Salted nuts and seeds. Canned beans with added salt. Canned or smoked fish. Whole eggs or egg yolks. Chicken or turkey with skin. Dairy Whole or 2% milk, cream, and half-and-half. Whole or full-fat cream cheese. Whole-fat or sweetened yogurt. Full-fat cheese. Nondairy creamers. Whipped toppings. Processed cheese and cheese spreads. Fats and oils Butter. Stick margarine. Lard. Shortening. Ghee. Bacon fat. Tropical oils, such as coconut, palm kernel, or palm oil. Seasonings and condiments Onion salt, garlic salt, seasoned salt, table salt, and sea salt. Worcestershire sauce. Tartar sauce. Barbecue sauce. Teriyaki sauce. Soy sauce, including reduced-sodium. Steak sauce. Canned and packaged gravies. Fish sauce. Oyster sauce. Cocktail sauce. Store-bought horseradish. Ketchup. Mustard. Meat flavorings and tenderizers. Bouillon cubes. Hot sauces. Pre-made or packaged marinades. Pre-made or packaged taco seasonings. Relishes. Regular salad dressings. Other foods Salted popcorn and pretzels. The items listed above may not be a complete list of foods and beverages you should avoid. Contact a dietitian for more information. Where to find more information  National Heart, Lung, and Blood Institute: www.nhlbi.nih.gov  American Heart Association: www.heart.org  Academy of Nutrition and Dietetics: www.eatright.org  National Kidney Foundation: www.kidney.org Summary  The DASH eating plan is a healthy eating plan that has been shown to reduce high   blood pressure (hypertension). It may also reduce your risk for type 2 diabetes, heart disease, and stroke.  When on the DASH eating plan, aim to eat more fresh fruits and vegetables, whole grains, lean proteins, low-fat dairy, and heart-healthy fats.  With the DASH eating plan, you should limit salt (sodium) intake to 2,300 mg a day. If you have hypertension, you may need to reduce your sodium intake to 1,500 mg a day.  Work with your health care  provider or dietitian to adjust your eating plan to your individual calorie needs. This information is not intended to replace advice given to you by your health care provider. Make sure you discuss any questions you have with your health care provider. Document Revised: 12/25/2018 Document Reviewed: 12/25/2018 Elsevier Patient Education  2021 Elsevier Inc.   

## 2020-04-01 LAB — CMP14+EGFR
ALT: 38 IU/L (ref 0–44)
AST: 31 IU/L (ref 0–40)
Albumin/Globulin Ratio: 1.8 (ref 1.2–2.2)
Albumin: 4.8 g/dL (ref 3.8–4.9)
Alkaline Phosphatase: 93 IU/L (ref 44–121)
BUN/Creatinine Ratio: 10 (ref 9–20)
BUN: 10 mg/dL (ref 6–24)
Bilirubin Total: 0.8 mg/dL (ref 0.0–1.2)
CO2: 25 mmol/L (ref 20–29)
Calcium: 10.5 mg/dL — ABNORMAL HIGH (ref 8.7–10.2)
Chloride: 100 mmol/L (ref 96–106)
Creatinine, Ser: 1 mg/dL (ref 0.76–1.27)
GFR calc Af Amer: 98 mL/min/{1.73_m2} (ref >59)
GFR calc non Af Amer: 85 mL/min/{1.73_m2} (ref >59)
Globulin, Total: 2.7 g/dL (ref 1.5–4.5)
Glucose: 95 mg/dL (ref 65–99)
Potassium: 4.8 mmol/L (ref 3.5–5.2)
Sodium: 139 mmol/L (ref 134–144)
Total Protein: 7.5 g/dL (ref 6.0–8.5)

## 2020-04-01 LAB — CBC WITH DIFFERENTIAL/PLATELET
Basophils Absolute: 0 10*3/uL (ref 0.0–0.2)
Basos: 1 %
EOS (ABSOLUTE): 0.1 10*3/uL (ref 0.0–0.4)
Eos: 2 %
Hematocrit: 43.9 % (ref 37.5–51.0)
Hemoglobin: 15.3 g/dL (ref 13.0–17.7)
Immature Grans (Abs): 0 10*3/uL (ref 0.0–0.1)
Immature Granulocytes: 0 %
Lymphocytes Absolute: 1.9 10*3/uL (ref 0.7–3.1)
Lymphs: 39 %
MCH: 30.2 pg (ref 26.6–33.0)
MCHC: 34.9 g/dL (ref 31.5–35.7)
MCV: 87 fL (ref 79–97)
Monocytes Absolute: 0.4 10*3/uL (ref 0.1–0.9)
Monocytes: 8 %
Neutrophils Absolute: 2.4 10*3/uL (ref 1.4–7.0)
Neutrophils: 50 %
Platelets: 219 10*3/uL (ref 150–450)
RBC: 5.06 x10E6/uL (ref 4.14–5.80)
RDW: 12.8 % (ref 11.6–15.4)
WBC: 4.7 10*3/uL (ref 3.4–10.8)

## 2020-04-01 LAB — THYROID PANEL WITH TSH
Free Thyroxine Index: 2.2 (ref 1.2–4.9)
T3 Uptake Ratio: 29 % (ref 24–39)
T4, Total: 7.5 ug/dL (ref 4.5–12.0)
TSH: 1.55 u[IU]/mL (ref 0.450–4.500)

## 2020-04-01 LAB — LIPID PANEL
Chol/HDL Ratio: 5.5 ratio — ABNORMAL HIGH (ref 0.0–5.0)
Cholesterol, Total: 159 mg/dL (ref 100–199)
HDL: 29 mg/dL — ABNORMAL LOW (ref >39)
LDL Chol Calc (NIH): 96 mg/dL (ref 0–99)
Triglycerides: 198 mg/dL — ABNORMAL HIGH (ref 0–149)
VLDL Cholesterol Cal: 34 mg/dL (ref 5–40)

## 2020-05-03 ENCOUNTER — Telehealth: Payer: Self-pay | Admitting: Pulmonary Disease

## 2020-05-03 DIAGNOSIS — G4733 Obstructive sleep apnea (adult) (pediatric): Secondary | ICD-10-CM

## 2020-05-03 NOTE — Telephone Encounter (Signed)
pt wife is calling because pt hasnt received cpap. order was sent 10/2019. States  DME keeps reaching out to let them know the machines are on backorder. pt is wondering if its any alternatives to getting machine, asked about switching DME companies, did advise pt wife that, that may delay the process. Please advise--[phone]

## 2020-05-03 NOTE — Telephone Encounter (Signed)
Called and spoke with Loni Beckwith, patient wife, per DPR who was calling because pt hasnt received cpap. order was sent 10/2019. States  DME keeps reaching out to let them know the machines are on backorder. pt is wondering if its any alternatives to getting machine, asked about switching DME companies, did advise pt wife that, that may delay the process. Wife expressed that she didn't want any more delays but wondered how much longer it would be until he get's a cpap machine.  Will send a community message to [facility] at [facility] to check on status of CPAP order.

## 2020-05-05 NOTE — Telephone Encounter (Signed)
Checked Tim Lopez's messages, did not see any message from [facility] yet. Will continue to wait.

## 2020-05-11 NOTE — Telephone Encounter (Signed)
Okay to send order for [facility] device.

## 2020-05-11 NOTE — Telephone Encounter (Signed)
Pt has been waiting on CPAP since 10/2019. Please advise if ok to send order for Luna CPAP. Thanks.

## 2020-05-11 NOTE — Telephone Encounter (Signed)
Order for CPAP and ok to use Luna machine has been placed. Called and spoke with wife Raynelle Fanning to give her the update. She expressed understanding. Nothing further needed at this time.

## 2020-07-06 ENCOUNTER — Ambulatory Visit: Payer: Federal, State, Local not specified - PPO | Admitting: Family Medicine

## 2020-07-06 ENCOUNTER — Encounter: Payer: Self-pay | Admitting: Family Medicine

## 2020-07-06 ENCOUNTER — Other Ambulatory Visit: Payer: Self-pay

## 2020-07-06 VITALS — BP 106/68 | HR 112 | Temp 97.4°F | Resp 20 | Ht 73.0 in | Wt 185.0 lb

## 2020-07-06 DIAGNOSIS — F4310 Post-traumatic stress disorder, unspecified: Secondary | ICD-10-CM | POA: Diagnosis not present

## 2020-07-06 DIAGNOSIS — Z566 Other physical and mental strain related to work: Secondary | ICD-10-CM

## 2020-07-06 DIAGNOSIS — I10 Essential (primary) hypertension: Secondary | ICD-10-CM

## 2020-07-06 DIAGNOSIS — G4733 Obstructive sleep apnea (adult) (pediatric): Secondary | ICD-10-CM

## 2020-07-06 DIAGNOSIS — K2100 Gastro-esophageal reflux disease with esophagitis, without bleeding: Secondary | ICD-10-CM

## 2020-07-06 NOTE — Progress Notes (Signed)
Assessment & Plan:  1. Essential hypertension Well controlled without medication now that he has changed his diet and is wearing his CPAP nightly.   2. OSA (obstructive sleep apnea) Managed by pulmonology. Continue wearing CPAP nightly.  3. Gastroesophageal reflux disease with esophagitis without hemorrhage Well controlled on current regimen.   4-5. PTSD (post-traumatic stress disorder)/Stress at work Under a lot of stress currently. Patient written out of work from 07/09/2020-08/10/2020. Education provided on mindfulness based stress reduction.    Return in about 4 months (around 11/05/2020) for annual physical.  Deliah Boston, MSN, APRN, FNP-C [facility]  Subjective:    Patient ID: Tim Lopez, male    DOB: 1965/11/28, 55 y.o.   MRN: [id number]  Patient Care Team: Gwenlyn Fudge, FNP as PCP - General (Family Medicine)   Chief Complaint:  Chief Complaint  Patient presents with   Medical Management of Chronic Issues    3 mo follow up - HTN / stress    HPI: VALMORE ARABIE is a 55 y.o. male presenting on 07/06/2020 for Medical Management of Chronic Issues (3 mo follow up - HTN / stress)  Patient previously complained of a stinging pain in his abdomen. His omeprazole was switched to Protonix. He had some testing done which revealed he has a gluten allergy. He has cut out dairy, gluten, and nuts. He states the pain in his abdomen has resolved and he overall feels much better.   Sleep apnea: managed by pulmonology. Wearing CPAP nightly.   Blood pressure was previously elevated, but is now much better.   New complaints: Patient reports he is feeling very stressed out at work. This is not the first time this has occurred. His job is very stressful and he has a history of PTSD. He is a disabled Cytogeneticist.   Depression screen [facility] 2/9 07/06/2020 03/31/2020 07/28/2019  Decreased Interest 0 0 0  Down, Depressed, Hopeless 1 0 1  PHQ - 2 Score 1 0 1   Altered sleeping 1 0 2  Tired, decreased energy 1 0 1  Change in appetite 0 0 0  Feeling bad or failure about yourself  0 0 0  Trouble concentrating 0 0 1  Moving slowly or fidgety/restless 0 0 0  Suicidal thoughts 0 0 0  PHQ-9 Score 3 0 5  Difficult doing work/chores Not difficult at all Not difficult at all -   GAD 7 : Generalized Anxiety Score 07/06/2020 03/31/2020 07/28/2019 01/22/2019  Nervous, Anxious, on Edge 1 0 1 1  Control/stop worrying 0 0 1 0  Worry too much - different things 0 0 1 0  Trouble relaxing 1 0 1 1  Restless 0 0 0 0  Easily annoyed or irritable 1 0 1 2  Afraid - awful might happen 0 0 0 0  Total GAD 7 Score 3 0 5 4  Anxiety Difficulty Not difficult at all Not difficult at all - Somewhat difficult    Social history:  Relevant past medical, surgical, family and social history reviewed and updated as indicated. Interim medical history since our last visit reviewed.  Allergies and medications reviewed and updated.  DATA REVIEWED: CHART IN EPIC  ROS: Negative unless specifically indicated above in HPI.    Current Outpatient Medications:    ibuprofen (ADVIL) 800 MG tablet, TAKE 1 TABLET BY MOUTH EVERY 8 HOURS AS NEEDED, Disp: 90 tablet, Rfl: 0   levothyroxine (EUTHYROX) 112 MCG tablet, Take 1 tablet (112 mcg total) by  mouth daily before breakfast., Disp: 90 tablet, Rfl: 1   pantoprazole (PROTONIX) 40 MG tablet, Take 1 tablet (40 mg total) by mouth daily., Disp: 30 tablet, Rfl: 2   Allergies  Allergen Reactions   Morphine And Related Hives   Past Medical History:  Diagnosis Date   Depression    GERD (gastroesophageal reflux disease)    H/O head injury    jaw fracture, dental injury, skull fracture. 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 No pertinent surgical history.  Social History   Socioeconomic History   Marital status: Married    Spouse name: Not on file   Number of children: Not on file    Years of education: Not on file   Highest education level: Not on file  Occupational History   Not on file  Tobacco Use   Smoking status: Never Smoker   Smokeless tobacco: Never Used  Vaping Use   Vaping Use: Never used  Substance and Sexual Activity   Alcohol use: No   Drug use: No   Sexual activity: Not on file  Other Topics Concern   Not on file  Social History Narrative   Not on file   Social Determinants of Health   Financial Resource Strain: Not on file  Food Insecurity: Not on file  Transportation Needs: Not on file  Physical Activity: Not on file  Stress: Not on file  Social Connections: Not on file  Intimate Partner Violence: Not on file        Objective:    BP 106/68   Pulse (!) 112   Temp (!) 97.4 F (36.3 C)   Resp 20   Ht 6\' 1"  (1.854 m)   Wt 185 lb (83.9 kg)   SpO2 96%   BMI 24.41 kg/m   Wt Readings from Last 3 Encounters:  07/06/20 185 lb (83.9 kg)  03/31/20 190 lb 3.2 oz (86.3 kg)  11/04/19 201 lb 9.6 oz (91.4 kg)    Physical Exam Vitals reviewed.  Constitutional:      General: He is not in acute distress.    Appearance: Normal appearance. He is normal weight. He is not ill-appearing, toxic-appearing or diaphoretic.  HENT:     Head: Normocephalic and atraumatic.  Eyes:     General: No scleral icterus.       Right eye: No discharge.        Left eye: No discharge.     Conjunctiva/sclera: Conjunctivae normal.  Cardiovascular:     Rate and Rhythm: Normal rate and regular rhythm.     Heart sounds: Normal heart sounds. No murmur heard.   No friction rub. No gallop.  Pulmonary:     Effort: Pulmonary effort is normal. No respiratory distress.     Breath sounds: Normal breath sounds. No stridor. No wheezing, rhonchi or rales.  Musculoskeletal:        General: Normal range of motion.     Cervical back: Normal range of motion.  Skin:    General: Skin is warm and dry.  Neurological:     Mental Status: He is alert and oriented to person,  place, and time. Mental status is at baseline.  Psychiatric:        Mood and Affect: Mood normal.        Behavior: Behavior normal.        Thought Content: Thought content normal.        Judgment: Judgment  normal.    Lab Results  Component Value Date   TSH 1.550 03/31/2020   Lab Results  Component Value Date   WBC 4.7 03/31/2020   HGB 15.3 03/31/2020   HCT 43.9 03/31/2020   MCV 87 03/31/2020   PLT 219 03/31/2020   Lab Results  Component Value Date   NA 139 03/31/2020   K 4.8 03/31/2020   CO2 25 03/31/2020   GLUCOSE 95 03/31/2020   BUN 10 03/31/2020   CREATININE 1.00 03/31/2020   BILITOT 0.8 03/31/2020   ALKPHOS 93 03/31/2020   AST 31 03/31/2020   ALT 38 03/31/2020   PROT 7.5 03/31/2020   ALBUMIN 4.8 03/31/2020   CALCIUM 10.5 (H) 03/31/2020   Lab Results  Component Value Date   CHOL 159 03/31/2020   Lab Results  Component Value Date   HDL 29 (L) 03/31/2020   Lab Results  Component Value Date   LDLCALC 96 03/31/2020   Lab Results  Component Value Date   TRIG 198 (H) 03/31/2020   Lab Results  Component Value Date   CHOLHDL 5.5 (H) 03/31/2020   No results found for: HGBA1C

## 2020-07-06 NOTE — Patient Instructions (Signed)
Mindfulness-Based Stress Reduction Mindfulness-based stress reduction (MBSR) is a program that helps people learn to practice mindfulness. Mindfulness is the practice of intentionally paying attention to the present moment. MBSR focuses on developing self-awareness, which allows you to respond to life stress without judgment or negative emotions. It can be learned and practiced through techniques such as education, breathing exercises, meditation, and yoga. MBSR includes several mindfulness techniques in one program. MBSR works best when you understand the treatment, are willing to try new things, and can commit to spending time practicing what you learn. MBSR training may include learning about:  How your emotions, thoughts, and reactions affect your body.  New ways to respond to things that cause negative thoughts to start (triggers).  How to notice your thoughts and let go of them.  Practicing awareness of everyday things that you normally do without thinking.  The techniques and goals of different types of meditation. What are the benefits of MBSR? MBSR can have many benefits, which include helping you to:  Develop self-awareness. This refers to knowing and understanding yourself.  Learn skills and attitudes that help you to participate in your own health care.  Learn new ways to care for yourself.  Be more accepting about how things are, and let things go.  Be less judgmental and approach things with an open mind.  Be patient with yourself and trust yourself more. MBSR has also been shown to:  Reduce negative emotions, such as depression and anxiety.  Improve memory and focus.  Change how you sense and approach pain.  Boost your body's ability to fight infections.  Help you connect better with other people.  Improve your sense of well-being. Follow these instructions at home:  Find a local in-person or online MBSR program.  Set aside some time regularly for  mindfulness practice.  Find a mindfulness practice that works best for you. This may include one or more of the following: ? Meditation. Meditation involves focusing your mind on a certain thought or activity. ? Breathing awareness exercises. These help you to stay present by focusing on your breath. ? Body scan. For this practice, you lie down and pay attention to each part of your body from head to toe. You can identify tension and soreness and intentionally relax parts of your body. ? Yoga. Yoga involves stretching and breathing, and it can improve your ability to move and be flexible. It can also provide an experience of testing your body's limits, which can help you release stress. ? Mindful eating. This way of eating involves focusing on the taste, texture, color, and smell of each bite of food. Because this slows down eating and helps you feel full sooner, it can be an important part of a weight-loss plan.  Find a podcast or recording that provides guidance for breathing awareness, body scan, or meditation exercises. You can listen to these any time when you have a free moment to rest without distractions.  Follow your treatment plan as told by your health care provider. This may include taking regular medicines and making changes to your diet or lifestyle as recommended.   How to practice mindfulness To do a basic awareness exercise:  Find a comfortable place to sit.  Pay attention to the present moment. Observe your thoughts, feelings, and surroundings just as they are.  Avoid placing judgment on yourself, your feelings, or your surroundings. Make note of any judgment that comes up, and let it go.  Your mind may wander, and that   is okay. Make note of when your thoughts drift, and return your attention to the present moment. To do basic mindfulness meditation:  Find a comfortable place to sit. This may include a stable chair or a firm floor cushion. ? Sit upright with your back  straight. Let your arms fall next to your side with your hands resting on your legs. ? If sitting in a chair, rest your feet flat on the floor. ? If sitting on a cushion, cross your legs in front of you.  Keep your head in a neutral position with your chin dropped slightly. Relax your jaw and rest the tip of your tongue on the roof of your mouth. Drop your gaze to the floor. You can close your eyes if you like.  Breathe normally and pay attention to your breath. Feel the air moving in and out of your nose. Feel your belly expanding and relaxing with each breath.  Your mind may wander, and that is okay. Make note of when your thoughts drift, and return your attention to your breath.  Avoid placing judgment on yourself, your feelings, or your surroundings. Make note of any judgment or feelings that come up, let them go, and bring your attention back to your breath.  When you are ready, lift your gaze or open your eyes. Pay attention to how your body feels after the meditation. Where to find more information You can find more information about MBSR from:  Your health care provider.  Community-based meditation centers or programs.  Programs offered near you. Summary  Mindfulness-based stress reduction (MBSR) is a program that teaches you how to intentionally pay attention to the present moment. It is used with other treatments to help you cope better with daily stress, emotions, and pain.  MBSR focuses on developing self-awareness, which allows you to respond to life stress without judgment or negative emotions.  MBSR programs may involve learning different mindfulness practices, such as breathing exercises, meditation, yoga, body scan, or mindful eating. Find a mindfulness practice that works best for you, and set aside time for it on a regular basis. This information is not intended to replace advice given to you by your health care provider. Make sure you discuss any questions you have  with your health care provider. Document Revised: 10/08/2019 Document Reviewed: 10/08/2019 Elsevier Patient Education  2021 Elsevier Inc.  

## 2020-07-07 ENCOUNTER — Ambulatory Visit: Payer: Federal, State, Local not specified - PPO | Admitting: Family Medicine

## 2020-07-18 ENCOUNTER — Encounter: Payer: Self-pay | Admitting: Family Medicine

## 2020-07-20 ENCOUNTER — Other Ambulatory Visit: Payer: Self-pay | Admitting: *Deleted

## 2020-07-20 DIAGNOSIS — E039 Hypothyroidism, unspecified: Secondary | ICD-10-CM

## 2020-07-20 MED ORDER — LEVOTHYROXINE SODIUM 112 MCG PO TABS: 112.0000 ug | ORAL_TABLET | Freq: Every day | ORAL | 1 refills | Status: DC

## 2020-07-31 ENCOUNTER — Ambulatory Visit: Payer: Federal, State, Local not specified - PPO | Admitting: Family Medicine

## 2020-07-31 ENCOUNTER — Other Ambulatory Visit: Payer: Self-pay

## 2020-08-09 ENCOUNTER — Other Ambulatory Visit: Payer: Self-pay

## 2020-08-09 ENCOUNTER — Encounter: Payer: Self-pay | Admitting: Family Medicine

## 2020-08-09 ENCOUNTER — Ambulatory Visit (INDEPENDENT_AMBULATORY_CARE_PROVIDER_SITE_OTHER): Payer: Federal, State, Local not specified - PPO | Admitting: Family Medicine

## 2020-08-09 VITALS — BP 119/77 | HR 75 | Temp 97.7°F | Ht 73.0 in | Wt 178.8 lb

## 2020-08-09 DIAGNOSIS — K5900 Constipation, unspecified: Secondary | ICD-10-CM

## 2020-08-09 DIAGNOSIS — F4310 Post-traumatic stress disorder, unspecified: Secondary | ICD-10-CM
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
## 2020-08-11 ENCOUNTER — Telehealth: Payer: Self-pay | Admitting: *Deleted

## 2020-08-11 ENCOUNTER — Encounter: Payer: Self-pay | Admitting: Family Medicine

## 2020-08-11 NOTE — Telephone Encounter (Signed)
Patient left  [place] an envelope to mail his FMLA paperwork back for him on the envelope the PO box and zip code[place] is missing. Please get correct address so we are able to mail.

## 2020-08-11 NOTE — Telephone Encounter (Signed)
Form mailed

## 2020-09-16 ENCOUNTER — Other Ambulatory Visit: Payer: Self-pay | Admitting: Family Medicine

## 2020-09-16 DIAGNOSIS — K222 Esophageal obstruction: Secondary | ICD-10-CM

## 2020-09-16 DIAGNOSIS — K2100 Gastro-esophageal reflux disease with esophagitis, without bleeding: Secondary | ICD-10-CM

## 2020-09-22 ENCOUNTER — Encounter: Payer: Self-pay | Admitting: Family Medicine

## 2020-09-22 DIAGNOSIS — K649 Unspecified hemorrhoids: Secondary | ICD-10-CM

## 2020-09-22 DIAGNOSIS — E039 Hypothyroidism, unspecified: Secondary | ICD-10-CM

## 2020-09-26 ENCOUNTER — Encounter: Payer: Self-pay | Admitting: Internal Medicine

## 2020-09-26 ENCOUNTER — Telehealth: Payer: Self-pay

## 2020-09-26 NOTE — Telephone Encounter (Signed)
Wife called office, he received appt letter for upcoming appt 02/2021. She requested for him to be added to cancellation list.

## 2020-10-02 ENCOUNTER — Encounter: Payer: Self-pay | Admitting: Family Medicine

## 2020-10-02 DIAGNOSIS — K649 Unspecified hemorrhoids: Secondary | ICD-10-CM

## 2020-10-03 MED ORDER — HYDROCORTISONE (PERIANAL) 2.5 % EX CREA: 1.0000 "application " | TOPICAL_CREAM | Freq: Two times a day (BID) | CUTANEOUS | 0 refills | Status: DC

## 2020-10-03 MED ORDER — HYDROCORTISONE ACETATE 25 MG RE SUPP: 25.0000 mg | Freq: Two times a day (BID) | RECTAL | 0 refills | Status: DC

## 2020-10-16 ENCOUNTER — Telehealth: Payer: Self-pay | Admitting: Family Medicine

## 2020-10-16 NOTE — Telephone Encounter (Signed)
Pt is still having issues with hemorrhoids and is barely able to walk. He made an appt for October 19th with Jarold Song but he wants to know if there is anything else he can try to help while he is waiting for this appt.

## 2020-10-17 NOTE — Telephone Encounter (Signed)
Pt. Needs to be seen for this. Thanks, WS 

## 2020-10-19 ENCOUNTER — Other Ambulatory Visit: Payer: Federal, State, Local not specified - PPO

## 2020-10-19 ENCOUNTER — Telehealth: Payer: Self-pay | Admitting: Family Medicine

## 2020-10-19 NOTE — Telephone Encounter (Signed)
Pt called stating that he needs to be referred to whatever [facility] Specialist that can see him ASAP. Says we referred him to see Melonie [place] but cant get an appt with them until January. Pt says he has seen a Dr Randa Evens before who works within [place] for [employer] but pt says he will go wherever he can go to be seen ASAP.   Please advise and call patient.

## 2020-10-19 NOTE — Telephone Encounter (Signed)
LM to call back for appt - may be virtual, tele, in person -jhb

## 2020-10-20 ENCOUNTER — Encounter: Payer: Self-pay | Admitting: Family Medicine

## 2020-10-20 ENCOUNTER — Ambulatory Visit (INDEPENDENT_AMBULATORY_CARE_PROVIDER_SITE_OTHER): Payer: Federal, State, Local not specified - PPO | Admitting: Family Medicine

## 2020-10-20 DIAGNOSIS — K649 Unspecified hemorrhoids: Secondary | ICD-10-CM
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
## 2020-11-02 ENCOUNTER — Other Ambulatory Visit: Payer: Self-pay

## 2020-11-02 ENCOUNTER — Encounter: Payer: Self-pay | Admitting: General Surgery

## 2020-11-02 ENCOUNTER — Ambulatory Visit: Payer: Federal, State, Local not specified - PPO | Admitting: General Surgery

## 2020-11-02 VITALS — BP 114/70 | HR 85 | Temp 98.3°F | Resp 16 | Ht 73.0 in | Wt 177.0 lb

## 2020-11-02 DIAGNOSIS — K602 Anal fissure, unspecified: Secondary | ICD-10-CM | POA: Diagnosis not present
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
## 2020-11-10 ENCOUNTER — Encounter: Payer: Federal, State, Local not specified - PPO | Admitting: Family Medicine

## 2020-11-13 ENCOUNTER — Other Ambulatory Visit: Payer: Self-pay | Admitting: Family Medicine

## 2020-11-15 ENCOUNTER — Other Ambulatory Visit: Payer: Self-pay | Admitting: Family Medicine

## 2020-11-15 DIAGNOSIS — K649 Unspecified hemorrhoids: Secondary | ICD-10-CM

## 2020-11-20 ENCOUNTER — Encounter: Payer: Self-pay | Admitting: Family Medicine

## 2020-11-20 DIAGNOSIS — K2100 Gastro-esophageal reflux disease with esophagitis, without bleeding: Secondary | ICD-10-CM

## 2020-11-20 MED ORDER — OMEPRAZOLE 40 MG PO CPDR: 40.0000 mg | DELAYED_RELEASE_CAPSULE | Freq: Every day | ORAL | 1 refills | Status: DC

## 2020-12-21 ENCOUNTER — Other Ambulatory Visit: Payer: Self-pay

## 2020-12-21 ENCOUNTER — Ambulatory Visit: Payer: Federal, State, Local not specified - PPO | Admitting: General Surgery

## 2020-12-21 ENCOUNTER — Encounter: Payer: Self-pay | Admitting: General Surgery

## 2020-12-21 VITALS — BP 134/86 | HR 78 | Temp 98.6°F | Resp 16 | Ht 73.0 in | Wt 180.0 lb

## 2020-12-21 DIAGNOSIS — K602 Anal fissure, unspecified: Secondary | ICD-10-CM | POA: Diagnosis not present

## 2020-12-21 NOTE — Progress Notes (Signed)
Subjective:     Tim Lopez  Patient here for follow-up of anal fissure.  Patient states that he has had several episodes of blood per rectum, but it is now resolved.  He has no rectal pain.  His bowel movements are normal.  He does have increased flatus, but he thinks it secondary to the multiple vitamins he is taking. Objective:    BP 134/86   Pulse 78   Temp 98.6 F (37 C) (Other (Comment))   Resp 16   Ht 6\' 1"  (1.854 m)   Wt 180 lb (81.6 kg)   SpO2 96%   BMI 23.75 kg/m   General:  alert, cooperative, and no distress  Rectal examination reveals a healed posterior anal fissure.     Assessment:    Anal fissure, resolved    Plan:   I told the patient that he needs a colonoscopy as he has not had one in over 10 years.  His last one was done in [facility].  He will call to schedule a colonoscopy.

## 2021-02-13 ENCOUNTER — Ambulatory Visit: Payer: Federal, State, Local not specified - PPO | Admitting: Gastroenterology

## 2021-02-16 ENCOUNTER — Encounter: Payer: Self-pay | Admitting: Family Medicine

## 2021-02-16 ENCOUNTER — Ambulatory Visit: Payer: Federal, State, Local not specified - PPO | Admitting: Family Medicine

## 2021-02-16 VITALS — BP 139/83 | HR 68 | Temp 97.4°F | Ht 73.0 in | Wt 184.6 lb

## 2021-02-16 DIAGNOSIS — R413 Other amnesia: Secondary | ICD-10-CM | POA: Diagnosis not present

## 2021-02-16 DIAGNOSIS — Z87828 Personal history of other (healed) physical injury and trauma: Secondary | ICD-10-CM | POA: Diagnosis not present

## 2021-02-16 DIAGNOSIS — Z0289 Encounter for other administrative examinations: Secondary | ICD-10-CM
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
## 2021-02-19 ENCOUNTER — Encounter: Payer: Self-pay | Admitting: Family Medicine

## 2021-06-27 ENCOUNTER — Ambulatory Visit: Payer: Federal, State, Local not specified - PPO | Admitting: Internal Medicine

## 2021-07-06 ENCOUNTER — Other Ambulatory Visit: Payer: Self-pay | Admitting: Family Medicine

## 2021-09-25 ENCOUNTER — Other Ambulatory Visit: Payer: Self-pay | Admitting: Family Medicine

## 2021-10-08 ENCOUNTER — Encounter (HOSPITAL_COMMUNITY): Payer: Self-pay | Admitting: Emergency Medicine

## 2021-10-08 ENCOUNTER — Emergency Department (HOSPITAL_COMMUNITY): Payer: Federal, State, Local not specified - PPO

## 2021-10-08 ENCOUNTER — Emergency Department (HOSPITAL_COMMUNITY)
Admission: EM | Admit: 2021-10-08 | Discharge: 2021-10-08 | Disposition: A | Discharge status: Home / Self Care | Payer: Federal, State, Local not specified - PPO | Attending: Emergency Medicine | Admitting: Emergency Medicine

## 2021-10-08 ENCOUNTER — Other Ambulatory Visit: Payer: Self-pay

## 2021-10-08 DIAGNOSIS — E039 Hypothyroidism, unspecified: Secondary | ICD-10-CM | POA: Diagnosis not present

## 2021-10-08 DIAGNOSIS — R112 Nausea with vomiting, unspecified: Secondary | ICD-10-CM | POA: Insufficient documentation

## 2021-10-08 DIAGNOSIS — R55 Syncope and collapse: Secondary | ICD-10-CM | POA: Insufficient documentation

## 2021-10-08 DIAGNOSIS — D72819 Decreased white blood cell count, unspecified: Secondary | ICD-10-CM | POA: Diagnosis not present

## 2021-10-08 DIAGNOSIS — R42 Dizziness and giddiness: Secondary | ICD-10-CM | POA: Insufficient documentation

## 2021-10-08 LAB — COMPREHENSIVE METABOLIC PANEL
ALT: 40 U/L (ref 0–44)
AST: 32 U/L (ref 15–41)
Albumin: 3.9 g/dL (ref 3.5–5.0)
Alkaline Phosphatase: 62 U/L (ref 38–126)
Anion gap: 7 (ref 5–15)
BUN: 11 mg/dL (ref 6–20)
CO2: 25 mmol/L (ref 22–32)
Calcium: 9.6 mg/dL (ref 8.9–10.3)
Chloride: 106 mmol/L (ref 98–111)
Creatinine, Ser: 1.04 mg/dL (ref 0.61–1.24)
GFR, Estimated: >60 mL/min (ref >60)
Glucose, Bld: 130 mg/dL — ABNORMAL HIGH (ref 70–99)
Potassium: 3.6 mmol/L (ref 3.5–5.1)
Sodium: 138 mmol/L (ref 135–145)
Total Bilirubin: 0.9 mg/dL (ref 0.3–1.2)
Total Protein: 6.4 g/dL — ABNORMAL LOW (ref 6.5–8.1)

## 2021-10-08 LAB — URINALYSIS, ROUTINE W REFLEX MICROSCOPIC
Bilirubin Urine: NEGATIVE
Glucose, UA: NEGATIVE mg/dL
Hgb urine dipstick: NEGATIVE
Ketones, ur: NEGATIVE mg/dL
Leukocytes,Ua: NEGATIVE
Nitrite: NEGATIVE
Protein, ur: NEGATIVE mg/dL
Specific Gravity, Urine: 1.02 (ref 1.005–1.030)
pH: 7 (ref 5.0–8.0)

## 2021-10-08 LAB — CBC WITH DIFFERENTIAL/PLATELET
Abs Immature Granulocytes: 0 10*3/uL (ref 0.00–0.07)
Basophils Absolute: 0 10*3/uL (ref 0.0–0.1)
Basophils Relative: 0 %
Eosinophils Absolute: 0.1 10*3/uL (ref 0.0–0.5)
Eosinophils Relative: 2 %
HCT: 36.1 % — ABNORMAL LOW (ref 39.0–52.0)
Hemoglobin: 12.9 g/dL — ABNORMAL LOW (ref 13.0–17.0)
Immature Granulocytes: 0 %
Lymphocytes Relative: 36 %
Lymphs Abs: 1.2 10*3/uL (ref 0.7–4.0)
MCH: 30.6 pg (ref 26.0–34.0)
MCHC: 35.7 g/dL (ref 30.0–36.0)
MCV: 85.5 fL (ref 80.0–100.0)
Monocytes Absolute: 0.2 10*3/uL (ref 0.1–1.0)
Monocytes Relative: 7 %
Neutro Abs: 1.8 10*3/uL (ref 1.7–7.7)
Neutrophils Relative %: 55 %
Platelets: 154 10*3/uL (ref 150–400)
RBC: 4.22 MIL/uL (ref 4.22–5.81)
RDW: 12.6 % (ref 11.5–15.5)
WBC: 3.2 10*3/uL — ABNORMAL LOW (ref 4.0–10.5)
nRBC: 0 % (ref 0.0–0.2)

## 2021-10-08 LAB — LIPASE, BLOOD: Lipase: 24 U/L (ref 11–51)

## 2021-10-08 LAB — RAPID URINE DRUG SCREEN, HOSP PERFORMED
Amphetamines: NOT DETECTED
Barbiturates: NOT DETECTED
Benzodiazepines: NOT DETECTED
Cocaine: NOT DETECTED
Opiates: NOT DETECTED
Tetrahydrocannabinol: NOT DETECTED

## 2021-10-08 LAB — BRAIN NATRIURETIC PEPTIDE: B Natriuretic Peptide: 15.6 pg/mL (ref 0.0–100.0)

## 2021-10-08 LAB — TROPONIN I (HIGH SENSITIVITY)
Troponin I (High Sensitivity): 3 ng/L (ref <18)
Troponin I (High Sensitivity): 3 ng/L (ref <18)

## 2021-10-08 MED ORDER — MECLIZINE HCL 25 MG PO TABS: 25.0000 mg | ORAL_TABLET | Freq: Three times a day (TID) | ORAL | 0 refills | Status: AC | PRN

## 2021-10-08 MED ORDER — IOHEXOL 350 MG/ML SOLN
75.0000 mL | Freq: Once | INTRAVENOUS | Status: AC | PRN
  Administered 2021-10-08: 75 mL via INTRAVENOUS

## 2021-10-08 MED ORDER — SODIUM CHLORIDE 0.9 % IV BOLUS
1000.0000 mL | Freq: Once | INTRAVENOUS | Status: AC
  Administered 2021-10-08: 1000 mL via INTRAVENOUS

## 2021-10-08 MED ORDER — DIAZEPAM 5 MG/ML IJ SOLN
2.5000 mg | Freq: Once | INTRAMUSCULAR | Status: AC
  Administered 2021-10-08: 2.5 mg via INTRAVENOUS
  Filled 2021-10-08: qty 2

## 2021-10-08 NOTE — ED Provider Notes (Signed)
[facility] EMERGENCY DEPARTMENT Provider Note   CSN: [id number] Arrival date & time: 10/08/21  1520     History  Chief Complaint  Patient presents with   Loss of Consciousness    Tim Lopez is a 56 y.o. male.  Tim Lopez is a 56 y.o. male with a history of hypothyroidism, otherwise healthy, who presents via EMS from work for evaluation of dizziness and syncopal episode.  Patient reports he has been having intermittent dizziness over the last 3 to 4 days.  He reports that today he drove into work and was sitting in his car noted that he was feeling dizzy.  He describes this as a room spinning sensation.  He had to use the bathroom so got up and walked into work.  He reports he stumbled 1 or 2 times trying to walk due to feeling off balance use the bathroom and got up continuing to feel dizzy and stumbled going to the locker room, he reports he started to feel worse and then fell out of his chair.  Coworkers reported that they found him passed out.  He regained consciousness and continue to report dizziness with some episodes of vomiting.  No associated chest pain, shortness of breath or palpitations.  He denies abdominal pain but reports an ongoing queasy feeling despite Zofran given with EMS.  No hematemesis.  He does report an ongoing anal fissure that he has some intermittent small-volume bleeding from.  No other blood in stool.  No history of similar symptoms.  No other aggravating or alleviating factors  The history is provided by the patient and the spouse.  Loss of Consciousness Associated symptoms: dizziness, nausea and vomiting   Associated symptoms: no chest pain, no fever, no headaches, no seizures, no shortness of breath and no weakness        Home Medications Prior to Admission medications   Medication Sig Start Date End Date Taking? Authorizing Provider  hydrocortisone (ANUSOL-HC) 25 MG suppository Place 1 suppository (25 mg total)  rectally 2 (two) times daily. 10/03/20   Deliah Boston F, FNP  ibuprofen (ADVIL) 800 MG tablet TAKE 1 TABLET BY MOUTH EVERY 8 HOURS AS NEEDED 07/06/21   Gwenlyn Fudge, FNP  levothyroxine (EUTHYROX) 112 MCG tablet Take 1 tablet (112 mcg total) by mouth daily before breakfast. 07/20/20   Gwenlyn Fudge, FNP  omeprazole (PRILOSEC) 40 MG capsule Take 1 capsule (40 mg total) by mouth daily. 11/20/20   Gwenlyn Fudge, FNP  PROCTO-MED [facility] 2.5 % rectal cream APPLY 1 APPLICATION RECTALLY TWICE DAILY 11/15/20   Deliah Boston F, FNP      Allergies    Morphine and related    Review of Systems   Review of Systems  Constitutional:  Negative for chills and fever.  HENT: Negative.    Eyes:  Negative for pain and visual disturbance.  Respiratory:  Negative for cough and shortness of breath.   Cardiovascular:  Positive for syncope. Negative for chest pain.  Gastrointestinal:  Positive for nausea and vomiting. Negative for abdominal pain.  Musculoskeletal:  Negative for myalgias.  Skin:  Negative for color change and wound.  Neurological:  Positive for dizziness and syncope. Negative for seizures, facial asymmetry, weakness, light-headedness, numbness and headaches.    Physical Exam Updated Vital Signs BP 126/84   Pulse 73   Temp (!) 97 F (36.1 C) (Oral)   Resp 17   Ht 6' (1.829 m)   Wt 86.2 kg  SpO2 96%   BMI 25.77 kg/m  Physical Exam Vitals and nursing note reviewed.  Constitutional:      General: He is not in acute distress.    Appearance: He is well-developed. He is not diaphoretic.     Comments: Alert, somewhat ill-appearing but in no acute distress  HENT:     Head: Normocephalic and atraumatic.     Mouth/Throat:     Mouth: Mucous membranes are moist.     Pharynx: Oropharynx is clear.  Eyes:     General:        Right eye: No discharge.        Left eye: No discharge.     Extraocular Movements: Extraocular movements intact.     Pupils: Pupils are equal, round, and reactive to  light.     Comments: No nystagmus noted, EOMs intact, PERRLA  Cardiovascular:     Rate and Rhythm: Normal rate and regular rhythm.     Pulses: Normal pulses.     Heart sounds: Normal heart sounds.  Pulmonary:     Effort: Pulmonary effort is normal. No respiratory distress.     Breath sounds: Normal breath sounds. No wheezing or rales.     Comments: Respirations equal and unlabored, patient able to speak in full sentences, lungs clear to auscultation bilaterally  Abdominal:     General: Bowel sounds are normal. There is no distension.     Palpations: Abdomen is soft. There is no mass.     Tenderness: There is no abdominal tenderness. There is no guarding.     Comments: Abdomen soft, nondistended, nontender to palpation in all quadrants without guarding or peritoneal signs  Musculoskeletal:        General: No deformity.     Cervical back: Neck supple.  Skin:    General: Skin is warm and dry.     Capillary Refill: Capillary refill takes less than 2 seconds.  Neurological:     Mental Status: He is alert and oriented to person, place, and time.     Coordination: Coordination normal.     Comments: Speech is clear, able to follow commands CN III-XII intact Normal strength in upper and lower extremities bilaterally including dorsiflexion and plantar flexion, strong and equal grip strength Sensation normal to light and sharp touch Moves extremities without ataxia, coordination intact Normal finger to nose and rapid alternating movements No pronator drift  Psychiatric:        Mood and Affect: Mood normal.        Behavior: Behavior normal.     ED Results / Procedures / Treatments   Labs (all labs ordered are listed, but only abnormal results are displayed) Labs Reviewed - No data to display  EKG EKG Interpretation  Date/Time:  Monday October 08 2021 15:32:51 EDT Ventricular Rate:  82 PR Interval:  210 QRS Duration: 115 QT Interval:  416 QTC Calculation: 486 R  Axis:   12 Text Interpretation: Sinus rhythm Prolonged PR interval Nonspecific intraventricular conduction delay Minimal ST elevation, inferior leads Confirmed by Linwood Dibbles [phone]) on 10/08/2021 3:39:18 PM  Radiology No results found.  Procedures Procedures    Medications Ordered in ED Medications - No data to display  ED Course/ Medical Decision Making/ A&P Clinical Course as of 10/08/21 1909  Mon Oct 08, 2021  1754 Stable 33 YOM with a minimal med hx with a chief complaint of lightheadedness for a few days progressing to syncope today after walking in from work. Still feeling nausea  and dizziness. Getting labs and CTs.   [CC]    Clinical Course User Index [CC] Glyn Ade, MD                           Medical Decision Making Amount and/or Complexity of Data Reviewed Labs: ordered. Radiology: ordered.  Risk Prescription drug management.   56 y.o. male presents to the ED with complaints of dizziness and syncope, this involves an extensive number of treatment options, and is a complaint that carries with it a high risk of complications and morbidity.  The differential diagnosis includes peripheral vertigo, central vertigo, vertebral artery, stroke, intracranial mass, Hyponatremia or other electrolyte derangement, dehydration, AKI  On arrival pt is nontoxic, vitals WNL.  Additional history obtained from wife at bedside. Previous records obtained and reviewed   I ordered medication including IV fluid bolus and Valium for nausea and dizziness  Lab Tests:  I Ordered, reviewed, and interpreted labs, which included: Mild leukopenia, stable hemoglobin of 12.1, glucose 130, no significant electrolyte derangements and normal liver function and normal lipase.  Urinalysis, UDS, BMP and troponin pending.  Imaging Studies ordered:  I ordered imaging studies which included CT head and CT chest as well as CT angio of the head and neck, I independently visualized and interpreted  imaging which showed no acute cardiopulmonary disease and no acute skull fracture, intracranial bleeding or mass.  CTA of the head and neck pending at shift change  ED Course:   Patient is currently symptom-free after receiving Valium and IV fluids.  On reexamination Dr. Doran Durand noted right beating nystagmus induced when turning his head rapidly.  This is much more suggestive of BPPV but given associated syncope CTA head and neck pending.  He will follow-up on his pending results, reevaluate patient and disposition appropriately.  Portions of this note were generated with Scientist, clinical (histocompatibility and immunogenetics). Dictation errors may occur despite best attempts at proofreading.         Final Clinical Impression(s) / ED Diagnoses Final diagnoses:  Dizziness and giddiness    Rx / DC Orders ED Discharge Orders     None         Legrand Rams 10/08/21 1917    Glyn Ade, MD 10/08/21 2304

## 2021-10-08 NOTE — ED Notes (Signed)
Aware of need for urine sample, urinal at bedside 

## 2021-10-08 NOTE — ED Triage Notes (Signed)
Pt to ER via EMS form work.  Reports of dizziness for last several days.  Today became dizzy and had a syncopal episode.  Pt also had vomiting upon EMS arrival.  Pt arrives to ER drowsy, but able to answer all questions appropriately.  Pt is A&O x 4.
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
## 2021-11-07 ENCOUNTER — Encounter: Payer: Self-pay | Admitting: Family Medicine

## 2021-11-07 ENCOUNTER — Ambulatory Visit: Payer: Federal, State, Local not specified - PPO | Admitting: Family Medicine

## 2021-11-07 VITALS — BP 133/78 | HR 86 | Temp 98.4°F | Ht 72.0 in | Wt 181.4 lb

## 2021-11-07 DIAGNOSIS — E039 Hypothyroidism, unspecified: Secondary | ICD-10-CM | POA: Diagnosis not present

## 2021-11-07 DIAGNOSIS — H8309 Labyrinthitis, unspecified ear: Secondary | ICD-10-CM

## 2021-11-07 DIAGNOSIS — F4310 Post-traumatic stress disorder, unspecified: Secondary | ICD-10-CM

## 2021-11-07 DIAGNOSIS — G4733 Obstructive sleep apnea (adult) (pediatric): Secondary | ICD-10-CM | POA: Diagnosis not present

## 2021-11-07 DIAGNOSIS — R42 Dizziness and giddiness: Secondary | ICD-10-CM | POA: Diagnosis not present
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
## 2021-12-18 ENCOUNTER — Ambulatory Visit: Payer: Federal, State, Local not specified - PPO | Admitting: Family Medicine

## 2022-03-18 ENCOUNTER — Ambulatory Visit: Payer: Federal, State, Local not specified - PPO | Admitting: Family Medicine

## 2022-03-27 ENCOUNTER — Ambulatory Visit: Payer: Federal, State, Local not specified - PPO | Admitting: Family Medicine

## 2022-03-27 ENCOUNTER — Encounter: Payer: Self-pay | Admitting: Family Medicine

## 2022-03-27 VITALS — BP 140/90 | HR 84 | Temp 98.3°F | Ht 72.0 in | Wt 177.0 lb

## 2022-03-27 DIAGNOSIS — K6289 Other specified diseases of anus and rectum: Secondary | ICD-10-CM | POA: Diagnosis not present

## 2022-03-27 MED ORDER — HYDROCORTISONE ACETATE 25 MG RE SUPP: 25.0000 mg | Freq: Four times a day (QID) | RECTAL | 1 refills | Status: DC | PRN

## 2022-03-27 NOTE — Progress Notes (Signed)
Subjective:  Patient ID: Tim Lopez, male    DOB: 20-Jan-1966  Age: 57 y.o. MRN: [id number]  CC: BOWEL LEAKAGE   HPI Tim Lopez presents for hx of anal fissure. Went to Dr. Arnoldo Morale dx with fissure.  Sx returning now. Missed work for the last half of December.  Not regular. Goes every 3-4 days. Then has a hard BM. Will have leakage of clear liquid constantly. Using pads Anus is tender. Uses Aquaphor or pramoxine cream with partial relief, but doesn't fix the leakage.      03/27/2022    2:44 PM 11/07/2021   11:05 AM 02/16/2021   10:28 AM  Depression screen PHQ 2/9  Decreased Interest 0 0 0  Down, Depressed, Hopeless 0 0 0  PHQ - 2 Score 0 0 0  Altered sleeping   0  Tired, decreased energy   0  Change in appetite   0  Feeling bad or failure about yourself    0  Trouble concentrating   0  Moving slowly or fidgety/restless   0  Suicidal thoughts   0  PHQ-9 Score   0  Difficult doing work/chores   Not difficult at all      11/07/2021   11:24 AM 02/16/2021   10:28 AM 08/09/2020    8:27 AM 07/06/2020    1:11 PM  GAD 7 : Generalized Anxiety Score  Nervous, Anxious, on Edge 1 0 0 1  Control/stop worrying 0 0 0 0  Worry too much - different things 0 0 0 0  Trouble relaxing 1 0 0 1  Restless 0 0 0 0  Easily annoyed or irritable 1 0 0 1  Afraid - awful might happen 0 0 0 0  Total GAD 7 Score 3 0 0 3  Anxiety Difficulty Not difficult at all Not difficult at all  Not difficult at all     History Tim Lopez has a past medical history of Depression, GERD (gastroesophageal reflux disease), H/O head injury, and Thyroid disease.   He has no past surgical history on file.   His family history includes Arthritis in his father; Diabetes in his father; Hyperlipidemia in his father; Hypertension in his father.He reports that he has never smoked. He has never used smokeless tobacco. He reports that he does not drink alcohol and does not use drugs.    ROS Review of  Systems  Objective:  BP (!) 140/90   Pulse 84   Temp 98.3 F (36.8 C)   Ht 6' (1.829 m)   Wt 177 lb (80.3 kg)   SpO2 95%   BMI 24.01 kg/m   BP Readings from Last 3 Encounters:  03/27/22 (!) 140/90  11/07/21 133/78  10/08/21 114/80    Wt Readings from Last 3 Encounters:  03/27/22 177 lb (80.3 kg)  11/07/21 181 lb 6.4 oz (82.3 kg)  10/08/21 190 lb (86.2 kg)     Physical Exam Constitutional:      General: He is in acute distress (anxious).  Abdominal:     Comments: Anal verge has slight erythema at the 4 o'clock position. There is tenderness distal to the pectinate line, but no palpable lesion       Assessment & Plan:   Amith was seen today for bowel leakage.  Diagnoses and all orders for this visit:  Proctitis -     Ambulatory referral to General Surgery  Other orders -     hydrocortisone (ANUSOL-HC) 25 MG suppository; Place 1 suppository (  25 mg total) rectally 4 (four) times daily as needed for hemorrhoids.    e   I am having Tim Lopez "Todd" start on hydrocortisone. I am also having him maintain his ibuprofen, NP Thyroid, Ascorbic Acid (VITAMIN C PO), VITAMIN D PO, Menaquinone-7 (VITAMIN K2 PO), and meclizine.  Allergies as of 03/27/2022       Reactions   Morphine And Related Hives        Medication List        Accurate as of March 27, 2022  3:35 PM. If you have any questions, ask your nurse or doctor.          hydrocortisone 25 MG suppository Commonly known as: ANUSOL-HC Place 1 suppository (25 mg total) rectally 4 (four) times daily as needed for hemorrhoids. Started by: Claretta Fraise, MD   ibuprofen 800 MG tablet Commonly known as: ADVIL TAKE 1 TABLET BY MOUTH EVERY 8 HOURS AS NEEDED What changed: reasons to take this   meclizine 25 MG tablet Commonly known as: ANTIVERT Take 1 tablet (25 mg total) by mouth 3 (three) times daily as needed for dizziness.   NP Thyroid 90 MG tablet Generic drug: thyroid Take  90 mg by mouth every morning.   VITAMIN C PO Take 1 tablet by mouth daily.   VITAMIN D PO Take 1 tablet by mouth daily.   VITAMIN K2 PO Take 1 tablet by mouth daily.         Follow-up: Return if symptoms worsen or fail to improve.  Claretta Fraise, M.D.

## 2022-04-09 ENCOUNTER — Ambulatory Visit: Payer: Federal, State, Local not specified - PPO | Admitting: General Surgery

## 2022-04-09 ENCOUNTER — Encounter: Payer: Self-pay | Admitting: General Surgery

## 2022-04-09 VITALS — BP 147/94 | HR 97 | Temp 97.8°F | Resp 12 | Ht 72.0 in | Wt 180.0 lb

## 2022-04-09 DIAGNOSIS — K648 Other hemorrhoids: Secondary | ICD-10-CM | POA: Diagnosis not present

## 2022-04-09 NOTE — H&P (Signed)
Tim Lopez; [id number]; 25-Jan-1966   HPI Patient is a 57 year old white male who was referred back to my care by Dr. Livia Snellen for evaluation and treatment of rectal pain.  I have treated the patient in the past for an anal fissure.  He states that intermittently over the past few months, he has had intermittent pain and blood per rectum.  He is constipated and has a bowel movement every 3 to 4 days.  This latest episode has been occurring for several weeks and is not resolving.  This is despite using creams and suppositories.  It is gone to the point where is hard for him to sit down.  He does have some stool leakage.  He wears a pad to control any bleeding. Past Medical History:  Diagnosis Date   Depression    GERD (gastroesophageal reflux disease)    H/O head injury    jaw fracture, dental injury, skull fracture. 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 No pertinent surgical history.  Family History  Problem Relation Age of Onset   Arthritis Father    Diabetes Father    Hyperlipidemia Father    Hypertension Father     Current Outpatient Medications on File Prior to Visit  Medication Sig Dispense Refill   ibuprofen (ADVIL) 800 MG tablet TAKE 1 TABLET BY MOUTH EVERY 8 HOURS AS NEEDED (Patient taking differently: Take 800 mg by mouth every 8 (eight) hours as needed for headache or moderate pain.) 90 tablet 0   meclizine (ANTIVERT) 25 MG tablet Take 1 tablet (25 mg total) by mouth 3 (three) times daily as needed for dizziness. 30 tablet 0   NP THYROID 90 MG tablet Take 90 mg by mouth every morning.     Ascorbic Acid (VITAMIN C PO) Take 1 tablet by mouth daily. (Patient not taking: Reported on 04/09/2022)     Menaquinone-7 (VITAMIN K2 PO) Take 1 tablet by mouth daily. (Patient not taking: Reported on 04/09/2022)     VITAMIN D PO Take 1 tablet by mouth daily. (Patient not taking: Reported on 04/09/2022)     No current  facility-administered medications on file prior to visit.    Allergies  Allergen Reactions   Morphine And Related Hives    Social History   Substance and Sexual Activity  Alcohol Use No    Social History   Tobacco Use  Smoking Status Never  Smokeless Tobacco Never    Review of Systems  Constitutional: Negative.   HENT: Negative.    Eyes: Negative.   Respiratory: Negative.    Cardiovascular: Negative.   Gastrointestinal:  Positive for constipation.  Genitourinary: Negative.   Musculoskeletal: Negative.   Skin: Negative.   Neurological: Negative.   Endo/Heme/Allergies: Negative.   Psychiatric/Behavioral: Negative.      Objective   Vitals:   04/09/22 1317  BP: (!) 147/94  Pulse: 97  Resp: 12  Temp: 97.8 F (36.6 C)  SpO2: 95%    Physical Exam Vitals reviewed.  Constitutional:      Appearance: Normal appearance. He is normal weight. He is not ill-appearing.  HENT:     Head: Normocephalic and atraumatic.  Cardiovascular:     Rate and Rhythm: Normal rate and regular rhythm.     Heart sounds: Normal heart sounds. No murmur heard.    No friction rub. No gallop.  Pulmonary:     Effort: Pulmonary effort is normal. No respiratory distress.  Breath sounds: Normal breath sounds. No stridor. No wheezing, rhonchi or rales.  Abdominal:     General: Abdomen is flat. Bowel sounds are normal. There is no distension.     Palpations: Abdomen is soft. There is no mass.     Tenderness: There is no abdominal tenderness. There is no guarding or rebound.     Hernia: No hernia is present.  Genitourinary:    Comments: Prominent prolapsing friable hemorrhoid is noted at the 4 o'clock position.  He also has another prominent hemorrhoid at the 8 o'clock position.  Digital examination was limited secondary to pain. Skin:    General: Skin is warm and dry.  Neurological:     Mental Status: He is alert and oriented to person, place, and time.     Assessment  Necrotic  protruding hemorrhoidal disease Plan  Patient will be taken to the operating room on 04/12/2022 for examination under anesthesia, hemorrhoidectomy.  The risks and benefits of the procedure including bleeding, infection, and recurrence of the hemorrhoidal disease were fully explained to the patient, who gave informed consent.

## 2022-04-09 NOTE — Progress Notes (Signed)
Tim Lopez; [id number]; 1966/01/28   HPI Patient is a 57 year old white male who was referred back to my care by Dr. Livia Snellen for evaluation and treatment of rectal pain.  I have treated the patient in the past for an anal fissure.  He states that intermittently over the past few months, he has had intermittent pain and blood per rectum.  He is constipated and has a bowel movement every 3 to 4 days.  This latest episode has been occurring for several weeks and is not resolving.  This is despite using creams and suppositories.  It is gone to the point where is hard for him to sit down.  He does have some stool leakage.  He wears a pad to control any bleeding. Past Medical History:  Diagnosis Date   Depression    GERD (gastroesophageal reflux disease)    H/O head injury    jaw fracture, dental injury, skull fracture. 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 No pertinent surgical history.  Family History  Problem Relation Age of Onset   Arthritis Father    Diabetes Father    Hyperlipidemia Father    Hypertension Father     Current Outpatient Medications on File Prior to Visit  Medication Sig Dispense Refill   ibuprofen (ADVIL) 800 MG tablet TAKE 1 TABLET BY MOUTH EVERY 8 HOURS AS NEEDED (Patient taking differently: Take 800 mg by mouth every 8 (eight) hours as needed for headache or moderate pain.) 90 tablet 0   meclizine (ANTIVERT) 25 MG tablet Take 1 tablet (25 mg total) by mouth 3 (three) times daily as needed for dizziness. 30 tablet 0   NP THYROID 90 MG tablet Take 90 mg by mouth every morning.     Ascorbic Acid (VITAMIN C PO) Take 1 tablet by mouth daily. (Patient not taking: Reported on 04/09/2022)     Menaquinone-7 (VITAMIN K2 PO) Take 1 tablet by mouth daily. (Patient not taking: Reported on 04/09/2022)     VITAMIN D PO Take 1 tablet by mouth daily. (Patient not taking: Reported on 04/09/2022)     No current  facility-administered medications on file prior to visit.    Allergies  Allergen Reactions   Morphine And Related Hives    Social History   Substance and Sexual Activity  Alcohol Use No    Social History   Tobacco Use  Smoking Status Never  Smokeless Tobacco Never    Review of Systems  Constitutional: Negative.   HENT: Negative.    Eyes: Negative.   Respiratory: Negative.    Cardiovascular: Negative.   Gastrointestinal:  Positive for constipation.  Genitourinary: Negative.   Musculoskeletal: Negative.   Skin: Negative.   Neurological: Negative.   Endo/Heme/Allergies: Negative.   Psychiatric/Behavioral: Negative.      Objective   Vitals:   04/09/22 1317  BP: (!) 147/94  Pulse: 97  Resp: 12  Temp: 97.8 F (36.6 C)  SpO2: 95%    Physical Exam Vitals reviewed.  Constitutional:      Appearance: Normal appearance. He is normal weight. He is not ill-appearing.  HENT:     Head: Normocephalic and atraumatic.  Cardiovascular:     Rate and Rhythm: Normal rate and regular rhythm.     Heart sounds: Normal heart sounds. No murmur heard.    No friction rub. No gallop.  Pulmonary:     Effort: Pulmonary effort is normal. No respiratory distress.  Breath sounds: Normal breath sounds. No stridor. No wheezing, rhonchi or rales.  Abdominal:     General: Abdomen is flat. Bowel sounds are normal. There is no distension.     Palpations: Abdomen is soft. There is no mass.     Tenderness: There is no abdominal tenderness. There is no guarding or rebound.     Hernia: No hernia is present.  Genitourinary:    Comments: Prominent prolapsing friable hemorrhoid is noted at the 4 o'clock position.  He also has another prominent hemorrhoid at the 8 o'clock position.  Digital examination was limited secondary to pain. Skin:    General: Skin is warm and dry.  Neurological:     Mental Status: He is alert and oriented to person, place, and time.     Assessment  Necrotic  protruding hemorrhoidal disease Plan  Patient will be taken to the operating room on 04/12/2022 for examination under anesthesia, hemorrhoidectomy.  The risks and benefits of the procedure including bleeding, infection, and recurrence of the hemorrhoidal disease were fully explained to the patient, who gave informed consent.

## 2022-04-10 ENCOUNTER — Encounter (HOSPITAL_COMMUNITY): Payer: Self-pay

## 2022-04-10 ENCOUNTER — Encounter (HOSPITAL_COMMUNITY)
Admission: RE | Admit: 2022-04-10 | Discharge: 2022-04-10 | Disposition: A | Discharge status: Home / Self Care | Payer: Federal, State, Local not specified - PPO | Source: Ambulatory Visit | Attending: General Surgery | Admitting: General Surgery

## 2022-04-10 HISTORY — DX: Sleep apnea, unspecified: G47.30

## 2022-04-12 ENCOUNTER — Ambulatory Visit (HOSPITAL_COMMUNITY)
Admission: RE | Admit: 2022-04-12 | Discharge: 2022-04-12 | Disposition: A | Discharge status: Home / Self Care | Payer: Federal, State, Local not specified - PPO | Attending: General Surgery | Admitting: General Surgery

## 2022-04-12 ENCOUNTER — Ambulatory Visit (HOSPITAL_COMMUNITY): Payer: Federal, State, Local not specified - PPO | Admitting: Anesthesiology

## 2022-04-12 ENCOUNTER — Encounter (HOSPITAL_COMMUNITY)
Admission: RE | Disposition: A | Discharge status: Home / Self Care | Payer: Self-pay | Source: Home / Self Care | Attending: General Surgery

## 2022-04-12 ENCOUNTER — Other Ambulatory Visit: Payer: Self-pay

## 2022-04-12 ENCOUNTER — Encounter (HOSPITAL_COMMUNITY): Payer: Self-pay | Admitting: General Surgery

## 2022-04-12 DIAGNOSIS — F419 Anxiety disorder, unspecified: Secondary | ICD-10-CM | POA: Diagnosis not present

## 2022-04-12 DIAGNOSIS — I10 Essential (primary) hypertension: Secondary | ICD-10-CM | POA: Insufficient documentation

## 2022-04-12 DIAGNOSIS — F32A Depression, unspecified: Secondary | ICD-10-CM | POA: Insufficient documentation

## 2022-04-12 DIAGNOSIS — K219 Gastro-esophageal reflux disease without esophagitis: Secondary | ICD-10-CM | POA: Insufficient documentation

## 2022-04-12 DIAGNOSIS — E039 Hypothyroidism, unspecified: Secondary | ICD-10-CM | POA: Diagnosis not present

## 2022-04-12 DIAGNOSIS — K644 Residual hemorrhoidal skin tags: Secondary | ICD-10-CM

## 2022-04-12 DIAGNOSIS — K648 Other hemorrhoids: Secondary | ICD-10-CM | POA: Insufficient documentation

## 2022-04-12 DIAGNOSIS — G4730 Sleep apnea, unspecified: Secondary | ICD-10-CM | POA: Insufficient documentation

## 2022-04-12 HISTORY — PX: HEMORRHOID SURGERY: SHX153

## 2022-04-12 SURGERY — HEMORRHOIDECTOMY
Anesthesia: General | Site: Rectum

## 2022-04-12 MED ORDER — LIDOCAINE VISCOUS HCL 2 % MT SOLN
OROMUCOSAL | Status: AC
  Filled 2022-04-12: qty 15

## 2022-04-12 MED ORDER — LIDOCAINE HCL (CARDIAC) PF 100 MG/5ML IV SOSY
PREFILLED_SYRINGE | INTRAVENOUS | Status: DC | PRN
  Administered 2022-04-12: 100 mg via INTRAVENOUS

## 2022-04-12 MED ORDER — OXYCODONE HCL 5 MG PO TABS: 5.0000 mg | ORAL_TABLET | Freq: Once | ORAL | Status: DC | PRN

## 2022-04-12 MED ORDER — CHLORHEXIDINE GLUCONATE CLOTH 2 % EX PADS: 6.0000 | MEDICATED_PAD | Freq: Once | CUTANEOUS | Status: DC

## 2022-04-12 MED ORDER — SODIUM CHLORIDE 0.9 % IV SOLN
2.0000 g | INTRAVENOUS | Status: AC
  Administered 2022-04-12: 2 g via INTRAVENOUS
  Filled 2022-04-12: qty 2

## 2022-04-12 MED ORDER — DEXMEDETOMIDINE HCL IN NACL 80 MCG/20ML IV SOLN
INTRAVENOUS | Status: AC
  Filled 2022-04-12: qty 20

## 2022-04-12 MED ORDER — PROPOFOL 10 MG/ML IV BOLUS
INTRAVENOUS | Status: AC
  Filled 2022-04-12: qty 20

## 2022-04-12 MED ORDER — BUPIVACAINE LIPOSOME 1.3 % IJ SUSP
INTRAMUSCULAR | Status: AC
  Filled 2022-04-12: qty 20

## 2022-04-12 MED ORDER — ONDANSETRON HCL 4 MG/2ML IJ SOLN
INTRAMUSCULAR | Status: AC
  Filled 2022-04-12: qty 2

## 2022-04-12 MED ORDER — KETOROLAC TROMETHAMINE 30 MG/ML IJ SOLN
INTRAMUSCULAR | Status: AC
  Filled 2022-04-12: qty 1

## 2022-04-12 MED ORDER — LACTATED RINGERS IV SOLN: INTRAVENOUS | Status: DC

## 2022-04-12 MED ORDER — SURGILUBE EX GEL
CUTANEOUS | Status: DC | PRN
  Administered 2022-04-12: 1 via TOPICAL

## 2022-04-12 MED ORDER — ONDANSETRON HCL 4 MG/2ML IJ SOLN: 4.0000 mg | Freq: Once | INTRAMUSCULAR | Status: DC | PRN

## 2022-04-12 MED ORDER — CHLORHEXIDINE GLUCONATE 0.12 % MT SOLN: 15.0000 mL | Freq: Once | OROMUCOSAL | Status: DC

## 2022-04-12 MED ORDER — FENTANYL CITRATE (PF) 100 MCG/2ML IJ SOLN
INTRAMUSCULAR | Status: AC
  Filled 2022-04-12: qty 2

## 2022-04-12 MED ORDER — DEXMEDETOMIDINE HCL IN NACL 80 MCG/20ML IV SOLN
INTRAVENOUS | Status: DC | PRN
  Administered 2022-04-12 (×2): 12 ug via BUCCAL

## 2022-04-12 MED ORDER — LIDOCAINE HCL (PF) 2 % IJ SOLN
INTRAMUSCULAR | Status: AC
  Filled 2022-04-12: qty 5

## 2022-04-12 MED ORDER — FENTANYL CITRATE PF 50 MCG/ML IJ SOSY
25.0000 ug | PREFILLED_SYRINGE | INTRAMUSCULAR | Status: DC | PRN
  Administered 2022-04-12: 50 ug via INTRAVENOUS
  Filled 2022-04-12: qty 1

## 2022-04-12 MED ORDER — PROPOFOL 10 MG/ML IV BOLUS
INTRAVENOUS | Status: DC | PRN
  Administered 2022-04-12: 250 mg via INTRAVENOUS
  Administered 2022-04-12: 20 mg via INTRAVENOUS
  Administered 2022-04-12: 50 mg via INTRAVENOUS
  Administered 2022-04-12 (×3): 20 mg via INTRAVENOUS

## 2022-04-12 MED ORDER — SODIUM CHLORIDE 0.9 % IR SOLN
Status: DC | PRN
  Administered 2022-04-12: 1

## 2022-04-12 MED ORDER — TRAMADOL HCL 50 MG PO TABS: 50.0000 mg | ORAL_TABLET | Freq: Four times a day (QID) | ORAL | 0 refills | Status: DC | PRN

## 2022-04-12 MED ORDER — LIDOCAINE HCL URETHRAL/MUCOSAL 2 % EX GEL
CUTANEOUS | Status: AC
  Filled 2022-04-12: qty 10

## 2022-04-12 MED ORDER — CHLORHEXIDINE GLUCONATE 0.12 % MT SOLN
OROMUCOSAL | Status: AC
  Filled 2022-04-12: qty 15

## 2022-04-12 MED ORDER — LIDOCAINE VISCOUS HCL 2 % MT SOLN
OROMUCOSAL | Status: DC | PRN
  Administered 2022-04-12: 1

## 2022-04-12 MED ORDER — EPHEDRINE SULFATE (PRESSORS) 50 MG/ML IJ SOLN
INTRAMUSCULAR | Status: DC | PRN
  Administered 2022-04-12 (×2): 5 mg via INTRAVENOUS

## 2022-04-12 MED ORDER — BUPIVACAINE LIPOSOME 1.3 % IJ SUSP
INTRAMUSCULAR | Status: DC | PRN
  Administered 2022-04-12: 20 mL

## 2022-04-12 MED ORDER — ORAL CARE MOUTH RINSE: 15.0000 mL | Freq: Once | OROMUCOSAL | Status: DC

## 2022-04-12 MED ORDER — PROPOFOL 500 MG/50ML IV EMUL
INTRAVENOUS | Status: AC
  Filled 2022-04-12: qty 50

## 2022-04-12 MED ORDER — KETOROLAC TROMETHAMINE 30 MG/ML IJ SOLN
INTRAMUSCULAR | Status: DC | PRN
  Administered 2022-04-12: 30 mg via INTRAVENOUS

## 2022-04-12 MED ORDER — SODIUM BICARBONATE 8.4 % IV SOLN
INTRAVENOUS | Status: AC
  Filled 2022-04-12: qty 50

## 2022-04-12 MED ORDER — OXYCODONE HCL 5 MG/5ML PO SOLN: 5.0000 mg | Freq: Once | ORAL | Status: DC | PRN

## 2022-04-12 SURGICAL SUPPLY — 28 items
CLOTH BEACON ORANGE TIMEOUT ST (SAFETY) ×1 IMPLANT
COVER LIGHT HANDLE STERIS (MISCELLANEOUS) ×2 IMPLANT
DISSECTOR SURG LIGASURE 21 (MISCELLANEOUS) ×1 IMPLANT
DRAPE HALF SHEET 40X57 (DRAPES) ×1 IMPLANT
ELECT REM PT RETURN 9FT ADLT (ELECTROSURGICAL) ×1
ELECTRODE REM PT RTRN 9FT ADLT (ELECTROSURGICAL) ×1 IMPLANT
GAUZE 4X4 16PLY ~~LOC~~+RFID DBL (SPONGE) ×1 IMPLANT
GAUZE SPONGE 4X4 12PLY STRL (GAUZE/BANDAGES/DRESSINGS) ×2 IMPLANT
GLOVE BIOGEL PI IND STRL 7.0 (GLOVE) ×2 IMPLANT
GLOVE SURG SS PI 7.5 STRL IVOR (GLOVE) ×2 IMPLANT
GOWN STRL REUS W/TWL LRG LVL3 (GOWN DISPOSABLE) ×2 IMPLANT
HEMOSTAT SURGICEL 4X8 (HEMOSTASIS) ×1 IMPLANT
KIT TURNOVER CYSTO (KITS) ×1 IMPLANT
MANIFOLD NEPTUNE II (INSTRUMENTS) ×1 IMPLANT
NDL HYPO 18GX1.5 BLUNT FILL (NEEDLE) ×1 IMPLANT
NDL HYPO 21X1.5 SAFETY (NEEDLE) ×1 IMPLANT
NEEDLE HYPO 18GX1.5 BLUNT FILL (NEEDLE) ×1 IMPLANT
NEEDLE HYPO 21X1.5 SAFETY (NEEDLE) ×1 IMPLANT
NS IRRIG 1000ML POUR BTL (IV SOLUTION) ×1 IMPLANT
PACK PERI GYN (CUSTOM PROCEDURE TRAY) ×1 IMPLANT
PAD ARMBOARD 7.5X6 YLW CONV (MISCELLANEOUS) ×1 IMPLANT
PENCIL SMOKE EVACUATOR (MISCELLANEOUS) ×1 IMPLANT
SET BASIN LINEN APH (SET/KITS/TRAYS/PACK) ×1 IMPLANT
SHEARS HARMONIC 9CM CVD (BLADE) ×1 IMPLANT
SURGILUBE 2OZ TUBE FLIPTOP (MISCELLANEOUS) ×1 IMPLANT
SUT SILK 0 FSL (SUTURE) ×1 IMPLANT
SUT VIC AB 2-0 CT2 27 (SUTURE) IMPLANT
SYR 20ML LL LF (SYRINGE) ×2 IMPLANT

## 2022-04-12 NOTE — Progress Notes (Signed)
Rectal Packing pulled before patient was d/c home.  String/packing came out together intact.  No drainage noted on the packing. Went over d/c instructions with patients wife.  Questions answered.

## 2022-04-12 NOTE — Interval H&P Note (Signed)
History and Physical Interval Note:  04/12/2022 7:23 AM  Tim Lopez  has presented today for surgery, with the diagnosis of [place].  The various methods of treatment have been discussed with the patient and family. After consideration of risks, benefits and other options for treatment, the patient has consented to  Procedure(s): HEMORRHOIDECTOMY, EXTENSIVE (N/A) as a surgical intervention.  The patient's history has been reviewed, patient examined, no change in status, stable for surgery.  I have reviewed the patient's chart and labs.  Questions were answered to the patient's satisfaction.     Aviva Signs

## 2022-04-12 NOTE — Anesthesia Postprocedure Evaluation (Signed)
Anesthesia Post Note  Patient: Tim Lopez  Procedure(s) Performed: HEMORRHOIDECTOMY, EXTENSIVE (Rectum)  Patient location during evaluation: Phase II Anesthesia Type: General Level of consciousness: awake Pain management: pain level controlled Vital Signs Assessment: post-procedure vital signs reviewed and stable Respiratory status: spontaneous breathing and respiratory function stable Cardiovascular status: blood pressure returned to baseline and stable Postop Assessment: no headache and no apparent nausea or vomiting Anesthetic complications: no Comments: Late entry   No notable events documented.   Last Vitals:  Vitals:   04/12/22 0930 04/12/22 0937  BP: 104/73 103/74  Pulse: 61 63  Resp: 12 11  Temp:  (!) 36.4 C  SpO2: 94% 97%    Last Pain:  Vitals:   04/12/22 0934  PainSc: [place]

## 2022-04-12 NOTE — Op Note (Signed)
Patient:  Tim Lopez  DOB:  Apr 13, 1965  MRN:  [id number]   Preop Diagnosis: Prolapsing internal and external hemorrhoids  Postop Diagnosis: Same  Procedure: Extensive hemorrhoidectomy  Surgeon: Aviva Signs, MD  Anes: General endotracheal  Indications: Patient is a 57 year old white male who presents with painful and friable hemorrhoidal disease.  The risks and benefits of the procedure including bleeding, infection, and the possibility of recurrence of the hemorrhoidal disease were fully explained to the patient, who gave informed consent.  Procedure note: The patient was placed in the lithotomy position after general anesthesia was administered.  The perineum was prepped and draped using the usual sterile technique with Betadine.  Surgical site confirmation was performed.  On examination of the anus, the patient had prominent friable internal and external hemorrhoids at the 4 and 7:00 positions.  There was no evidence of an acute anal fissure.  No stricture was present.  Both the hemorrhoids at the 4 and 7:00 positions were excised in a column like fashion using the hand-held LigaSure.  A bleeding was controlled using Bovie electrocautery.  The patient had circumferential laxity of the mucosa with evidence of other smaller hemorrhoids.  The external sphincter was intact.  Exparel was instilled into the surrounding perineum.  The rectum was packed with Surgicel and viscous Xylocaine.  All tape and needle counts were correct at the end of the procedure.  The patient was awakened and transferred to PACU in stable condition.  Complications: None  EBL: Minimal  Specimen: Hemorrhoids

## 2022-04-12 NOTE — Transfer of Care (Signed)
Immediate Anesthesia Transfer of Care Note  Patient: Tim Lopez  Procedure(s) Performed: HEMORRHOIDECTOMY, EXTENSIVE (Rectum)  Patient Location: PACU  Anesthesia Type:General  Level of Consciousness: awake and patient cooperative  Airway & Oxygen Therapy: Patient Spontanous Breathing  Post-op Assessment: Report given to RN and Post -op Vital signs reviewed and stable  Post vital signs: Reviewed and stable  Last Vitals:  Vitals Value Taken Time  BP 98/68 0839  Temp 36.4 C 04/12/22 0836  Pulse 71 04/12/22 0838  Resp 12 04/12/22 0838  SpO2 96 % 04/12/22 0838  Vitals shown include unvalidated device data.  Last Pain:  Vitals:   04/12/22 0626  PainSc: 0-No pain         Complications: No notable events documented.

## 2022-04-12 NOTE — Discharge Instructions (Signed)
May do Sitz bath in three days

## 2022-04-12 NOTE — Anesthesia Procedure Notes (Signed)
Procedure Name: LMA Insertion Date/Time: 04/12/2022 7:46 AM  Performed by: Vista Deck, CRNAPre-anesthesia Checklist: Patient identified, Patient being monitored, Timeout performed, Emergency Drugs available and Suction available Patient Re-evaluated:Patient Re-evaluated prior to induction Oxygen Delivery Method: Circle System Utilized Preoxygenation: Pre-oxygenation with 100% oxygen Induction Type: IV induction Ventilation: Mask ventilation without difficulty LMA: LMA inserted LMA Size: 4.0 Number of attempts: 1 Placement Confirmation: positive ETCO2 and breath sounds checked- equal and bilateral Tube secured with: Tape Dental Injury: Teeth and Oropharynx as per pre-operative assessment

## 2022-04-12 NOTE — Anesthesia Preprocedure Evaluation (Signed)
Anesthesia Evaluation  Patient identified by MRN, date of birth, ID band Patient awake    Reviewed: Allergy & Precautions, H&P , NPO status , Patient's Chart, lab work & pertinent test results, reviewed documented beta blocker date and time   Airway Mallampati: II  TM Distance: >3 FB Neck ROM: full    Dental no notable dental hx.    Pulmonary neg pulmonary ROS, sleep apnea    Pulmonary exam normal breath sounds clear to auscultation       Cardiovascular Exercise Tolerance: Good hypertension, negative cardio ROS  Rhythm:regular Rate:Normal     Neuro/Psych  PSYCHIATRIC DISORDERS Anxiety Depression    negative neurological ROS  negative psych ROS   GI/Hepatic negative GI ROS, Neg liver ROS,GERD  ,,  Endo/Other  negative endocrine ROSHypothyroidism    Renal/GU negative Renal ROS  negative genitourinary   Musculoskeletal   Abdominal   Peds  Hematology negative hematology ROS (+)   Anesthesia Other Findings   Reproductive/Obstetrics negative OB ROS                             Anesthesia Physical Anesthesia Plan  ASA: 2  Anesthesia Plan: General and General LMA   Post-op Pain Management:    Induction:   PONV Risk Score and Plan: Ondansetron  Airway Management Planned:   Additional Equipment:   Intra-op Plan:   Post-operative Plan:   Informed Consent: I have reviewed the patients History and Physical, chart, labs and discussed the procedure including the risks, benefits and alternatives for the proposed anesthesia with the patient or authorized representative who has indicated his/her understanding and acceptance.     Dental Advisory Given  Plan Discussed with: CRNA  Anesthesia Plan Comments:        Anesthesia Quick Evaluation

## 2022-04-15 LAB — SURGICAL PATHOLOGY

## 2022-04-18 ENCOUNTER — Encounter: Payer: Self-pay | Admitting: General Surgery

## 2022-04-18 ENCOUNTER — Ambulatory Visit (INDEPENDENT_AMBULATORY_CARE_PROVIDER_SITE_OTHER): Payer: Federal, State, Local not specified - PPO | Admitting: General Surgery

## 2022-04-18 ENCOUNTER — Encounter: Payer: Self-pay | Admitting: *Deleted

## 2022-04-18 VITALS — BP 158/97 | HR 83 | Temp 97.9°F | Resp 14 | Ht 72.0 in | Wt 176.0 lb

## 2022-04-18 DIAGNOSIS — Z09 Encounter for follow-up examination after completed treatment for conditions other than malignant neoplasm: Secondary | ICD-10-CM

## 2022-04-18 NOTE — Progress Notes (Signed)
Subjective:     Tim Lopez  Patient here for follow-up, status post extensive hemorrhoidectomy.  He states his preoperative pain has been replaced by incisional pain.  He is progressively getting better and taking himself off of pain pills.  He is doing sitz bath's once a day.  His constipation has resolved with taking over-the-counter mineral oil.  He has had minimal blood per rectum. Objective:    BP (!) 158/97   Pulse 83   Temp 97.9 F (36.6 C) (Oral)   Resp 14   Ht 6' (1.829 m)   Wt 176 lb (79.8 kg)   SpO2 97%   BMI 23.87 kg/m   General:  alert, cooperative, and no distress  Rectal examination externally reveals no swelling or blood present.     Assessment:    Doing well postoperatively. He is progressing as expected.    Plan:   Rectiv samples given for pain.  He may return to light duty work on 04/20/2022.  Follow-up here on 04/30/2022.

## 2022-04-23 ENCOUNTER — Other Ambulatory Visit: Payer: Self-pay | Admitting: Family Medicine

## 2022-04-23 ENCOUNTER — Encounter (HOSPITAL_COMMUNITY): Payer: Self-pay | Admitting: General Surgery

## 2022-04-30 ENCOUNTER — Ambulatory Visit (INDEPENDENT_AMBULATORY_CARE_PROVIDER_SITE_OTHER): Payer: Federal, State, Local not specified - PPO | Admitting: General Surgery

## 2022-04-30 ENCOUNTER — Encounter: Payer: Self-pay | Admitting: General Surgery

## 2022-04-30 ENCOUNTER — Other Ambulatory Visit: Payer: Self-pay

## 2022-04-30 VITALS — BP 159/94 | HR 71 | Temp 97.5°F | Resp 18 | Ht 72.0 in | Wt 180.0 lb

## 2022-04-30 DIAGNOSIS — Z09 Encounter for follow-up examination after completed treatment for conditions other than malignant neoplasm: Secondary | ICD-10-CM

## 2022-04-30 NOTE — Progress Notes (Signed)
Subjective:     Tim Lopez  Unexpected visit.  Patient is complaining of itching around his anus.  Is using medicated wipes and hydrocortisone cream.  Especially affects him at night.  No soilage of his underwear is noted.  He is pleased with the results otherwise from his surgery. Objective:    BP (!) 159/94   Pulse 71   Temp (!) 97.5 F (36.4 C) (Oral)   Resp 18   Ht 6' (1.829 m)   Wt 180 lb (81.6 kg)   SpO2 95%   BMI 24.41 kg/m   General:  alert, cooperative, and no distress  Rectal examination shows good healing from the hemorrhoidectomy.  No swelling is present.  There is no significant erythema or dry skin around the anus.     Assessment:    Doing well postoperatively. Postoperative pruritus ani    Plan:   I told the patient to stop the medicated wipes and the hydrocortisone cream.  Hopefully this will resolve with time.  Should his itching worsen, nystatin cream may be indicated.  He will follow-up with me as needed.

## 2022-11-25 ENCOUNTER — Telehealth: Payer: Self-pay | Admitting: Family Medicine

## 2022-11-25 NOTE — Telephone Encounter (Signed)
Just write that I had a death in the family, please

## 2022-11-25 NOTE — Telephone Encounter (Signed)
Patient calling back because he received the message about PCP being out and wants to know if he can come in to talk to someone else about FMLA ppw

## 2022-11-25 NOTE — Telephone Encounter (Signed)
Okay with me 

## 2022-11-27 ENCOUNTER — Ambulatory Visit: Payer: Federal, State, Local not specified - PPO | Admitting: Family Medicine

## 2022-12-25 ENCOUNTER — Ambulatory Visit: Payer: Federal, State, Local not specified - PPO | Admitting: Family Medicine
